# Patient Record
Sex: Female | Born: 1995 | Race: Black or African American | Hispanic: No | Smoking: Never smoker
Health system: Southern US, Community
[De-identification: ages and names within clinical notes are randomized; demographics above are authoritative.]

## PROBLEM LIST (undated history)

## (undated) DIAGNOSIS — Z789 Other specified health status: Secondary | ICD-10-CM

## (undated) HISTORY — PX: WISDOM TOOTH EXTRACTION: SHX21

---

## 2014-09-13 ENCOUNTER — Encounter (HOSPITAL_COMMUNITY): Payer: Self-pay | Admitting: Emergency Medicine

## 2014-09-13 ENCOUNTER — Emergency Department (HOSPITAL_COMMUNITY)
Admission: EM | Admit: 2014-09-13 | Discharge: 2014-09-13 | Disposition: A | Discharge status: Home / Self Care | Payer: No Typology Code available for payment source | Attending: Emergency Medicine | Admitting: Emergency Medicine

## 2014-09-13 DIAGNOSIS — Y9389 Activity, other specified: Secondary | ICD-10-CM | POA: Diagnosis not present

## 2014-09-13 DIAGNOSIS — Y92410 Unspecified street and highway as the place of occurrence of the external cause: Secondary | ICD-10-CM | POA: Diagnosis not present

## 2014-09-13 DIAGNOSIS — Z041 Encounter for examination and observation following transport accident: Secondary | ICD-10-CM | POA: Diagnosis not present

## 2014-09-13 DIAGNOSIS — Y998 Other external cause status: Secondary | ICD-10-CM | POA: Diagnosis not present
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
## 2017-03-16 ENCOUNTER — Ambulatory Visit (HOSPITAL_COMMUNITY)
Admission: EM | Admit: 2017-03-16 | Discharge: 2017-03-16 | Disposition: A | Discharge status: Home / Self Care | Payer: Managed Care, Other (non HMO) | Attending: Internal Medicine | Admitting: Internal Medicine

## 2017-03-16 ENCOUNTER — Encounter (HOSPITAL_COMMUNITY): Payer: Self-pay | Admitting: Emergency Medicine

## 2017-03-16 DIAGNOSIS — R1084 Generalized abdominal pain: Secondary | ICD-10-CM | POA: Diagnosis not present

## 2017-03-16 LAB — POCT URINALYSIS DIP (DEVICE)
BILIRUBIN URINE: NEGATIVE
GLUCOSE, UA: NEGATIVE mg/dL
Hgb urine dipstick: NEGATIVE
KETONES UR: NEGATIVE mg/dL
LEUKOCYTES UA: NEGATIVE
NITRITE: NEGATIVE
PH: 8.5 — AB (ref 5.0–8.0)
Protein, ur: 100 mg/dL — AB
Specific Gravity, Urine: 1.01 (ref 1.005–1.030)
Urobilinogen, UA: 1 mg/dL (ref 0.0–1.0)

## 2017-03-16 LAB — POCT PREGNANCY, URINE: PREG TEST UR: NEGATIVE

## 2017-03-16 MED ORDER — RANITIDINE HCL 150 MG PO TABS: 150.0000 mg | ORAL_TABLET | Freq: Two times a day (BID) | ORAL | 0 refills | Status: AC

## 2017-03-16 MED ORDER — DICYCLOMINE HCL 20 MG PO TABS: 20.0000 mg | ORAL_TABLET | Freq: Three times a day (TID) | ORAL | 0 refills | Status: DC

## 2017-03-16 NOTE — ED Provider Notes (Signed)
[facility]    CSN: [id number] Arrival date & time: 03/16/17  1709     History   Chief Complaint Chief Complaint  Patient presents with  . Abdominal Pain    HPI Megan Reyes is a 22 y.o. female no significant past medical history presenting today with abdominal pain.  States 4 days ago she developed abdominal bloating while she was eating, and felt like her stomach was distended.  The bloating went away, but her pain turned into cramping.  She has had persistent cramping for the past 3 days.  States that it does worsen when she eats, but is still meaning her appetite.  Denies associated nausea or vomiting, and states her bowel movements have been regular.  No diarrhea or constipation.  Last bowel movement was 2 hours ago, normal consistency.  Has daily bowel movements.  Last menstrual cycle was 2/28, patient is not on any form of birth control.  She denies any abnormal vaginal discharge or pelvic pain.  Denies urinary symptoms of dysuria and increased frequency, but does endorse some sensations of incomplete voiding.  She is taking ibuprofen for the pain which helped a little bit, but the pain returned.  Denies any chest pain, chest burning, shortness of breath.  Denies any URI symptoms of cough, congestion, sore throat.  Denies fevers.  HPI  History reviewed. No pertinent past medical history.  There are no active problems to display for this patient.   History reviewed. No pertinent surgical history.  OB History    No data available       Home Medications    Prior to Admission medications   Medication Sig Start Date End Date Taking? Authorizing Provider  dicyclomine (BENTYL) 20 MG tablet Take 1 tablet (20 mg total) by mouth 4 (four) times daily -  before meals and at bedtime for 7 days. 03/16/17 03/23/17  Naylee Frankowski C, PA-C  ranitidine (ZANTAC) 150 MG tablet Take 1 tablet (150 mg total) by mouth 2 (two) times daily for 14 days. 03/16/17 03/30/17  Goble Fudala, Junius CreamerHallie  C, PA-C    Family History No family history on file.  Social History Social History   Tobacco Use  . Smoking status: Never Smoker  Substance Use Topics  . Alcohol use: No  . Drug use: Not on file     Allergies   Patient has no known allergies.   Review of Systems Review of Systems  Constitutional: Negative for fever.  Respiratory: Negative for shortness of breath.   Cardiovascular: Negative for chest pain.  Gastrointestinal: Positive for abdominal pain. Negative for blood in stool, constipation, diarrhea, nausea and vomiting.  Genitourinary: Negative for dysuria, flank pain, genital sores, hematuria, menstrual problem, vaginal bleeding, vaginal discharge and vaginal pain.  Musculoskeletal: Negative for back pain.  Skin: Negative for rash.  Neurological: Negative for dizziness, light-headedness and headaches.     Physical Exam Triage Vital Signs ED Triage Vitals  Enc Vitals Group     BP 03/16/17 1815 115/68     Pulse Rate 03/16/17 1815 90     Resp 03/16/17 1815 16     Temp 03/16/17 1815 98.9 F (37.2 C)     Temp Source 03/16/17 1815 Oral     SpO2 03/16/17 1815 100 %     Weight 03/16/17 1815 140 lb (63.5 kg)     Height 03/16/17 1815 5\' 2"  (1.575 m)     Head Circumference --      Peak Flow --  Pain Score 03/16/17 1814 6     Pain Loc --      Pain Edu? --      Excl. in GC? --    No data found.  Updated Vital Signs BP 115/68   Pulse 90   Temp 98.9 F (37.2 C) (Oral)   Resp 16   Ht 5\' 2"  (1.575 m)   Wt 140 lb (63.5 kg)   LMP 03/02/2017   SpO2 100%   BMI 25.61 kg/m   Visual Acuity Right Eye Distance:   Left Eye Distance:   Bilateral Distance:    Right Eye Near:   Left Eye Near:    Bilateral Near:     Physical Exam  Constitutional: She appears well-developed and well-nourished. No distress.  HENT:  Head: Normocephalic and atraumatic.  Eyes: Conjunctivae are normal.  Neck: Neck supple.  Cardiovascular: Normal rate and regular rhythm.  No  murmur heard. Pulmonary/Chest: Effort normal and breath sounds normal. No respiratory distress.  Abdominal: Soft. There is tenderness.  Abdomen is soft, nondistended, patient does not guard during exam, bowel sounds present throughout all 4 quadrants, mild tenderness diffusely to entire abdomen, negative rebound, negative Murphy's, negative McBurney's, negative Rovsing's  Patient notes that palpation does not worsen pain, just makes her more aware that it is there.  Musculoskeletal: She exhibits no edema.  Neurological: She is alert.  Skin: Skin is warm and dry.  Psychiatric: She has a normal mood and affect.  Nursing note and vitals reviewed.    UC Treatments / Results  Labs (all labs ordered are listed, but only abnormal results are displayed) Labs Reviewed  POCT URINALYSIS DIP (DEVICE) - Abnormal; Notable for the following components:      Result Value   pH 8.5 (*)    Protein, ur 100 (*)    All other components within normal limits  POCT PREGNANCY, URINE    EKG  EKG Interpretation None       Radiology No results found.  Procedures Procedures (including critical care time)  Medications Ordered in UC Medications - No data to display   Initial Impression / Assessment and Plan / UC Course  I have reviewed the triage vital signs and the nursing notes.  Pertinent labs & imaging results that were available during my care of the patient were reviewed by me and considered in my medical decision making (see chart for details).     UA negative, pregnancy test negative, will do trial of Zantac to see if this helps with her pain as well as trial of Bentyl.  Abdomen does not appear emergent at this time or need of further evaluation.  Abdominal exam negative for peritoneal signs.  Minimal associated symptoms.  Discussed strict return precautions. Patient verbalized understanding and is agreeable with plan.   Final Clinical Impressions(s) / UC Diagnoses   Final diagnoses:    Generalized abdominal pain    ED Discharge Orders        Ordered    dicyclomine (BENTYL) 20 MG tablet  3 times daily before meals & bedtime     03/16/17 1905    ranitidine (ZANTAC) 150 MG tablet  2 times daily     03/16/17 1905       Controlled Substance Prescriptions [place] Controlled Substance Registry consulted? Not Applicable   Lew Dawes, [place] 03/16/17 1910

## 2017-03-16 NOTE — Discharge Instructions (Signed)
Please begin Bentyl as prescribed, this is meant to help with spasms of the GI tract.  Please begin Zantac twice daily for the next 2 weeks, this will help with reflux if this is contributing to your pain.  Please return if symptoms worsening or changing, please return if they are not improving in 1-2 weeks.

## 2017-03-16 NOTE — ED Triage Notes (Signed)
PT reports abdominal bloating that started 3 days ago. Bloating resolved, but pain remained.   Denies N/V/D and constipation.
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
## 2018-02-07 ENCOUNTER — Emergency Department (HOSPITAL_COMMUNITY)
Admission: EM | Admit: 2018-02-07 | Discharge: 2018-02-07 | Disposition: A | Discharge status: Home / Self Care | Payer: Managed Care, Other (non HMO) | Attending: Emergency Medicine | Admitting: Emergency Medicine

## 2018-02-07 ENCOUNTER — Emergency Department (HOSPITAL_COMMUNITY): Payer: Managed Care, Other (non HMO)

## 2018-02-07 ENCOUNTER — Encounter (HOSPITAL_COMMUNITY): Payer: Self-pay | Admitting: Emergency Medicine

## 2018-02-07 DIAGNOSIS — Z7982 Long term (current) use of aspirin: Secondary | ICD-10-CM | POA: Insufficient documentation

## 2018-02-07 DIAGNOSIS — Z79899 Other long term (current) drug therapy: Secondary | ICD-10-CM | POA: Insufficient documentation

## 2018-02-07 DIAGNOSIS — Z331 Pregnant state, incidental: Secondary | ICD-10-CM

## 2018-02-07 DIAGNOSIS — R1011 Right upper quadrant pain: Secondary | ICD-10-CM | POA: Diagnosis not present

## 2018-02-07 LAB — CBC WITH DIFFERENTIAL/PLATELET
Abs Immature Granulocytes: 0.08 10*3/uL — ABNORMAL HIGH (ref 0.00–0.07)
BASOS PCT: 1 %
Basophils Absolute: 0.1 10*3/uL (ref 0.0–0.1)
EOS PCT: 1 %
Eosinophils Absolute: 0.1 10*3/uL (ref 0.0–0.5)
HCT: 44.4 % (ref 36.0–46.0)
HEMOGLOBIN: 14.4 g/dL (ref 12.0–15.0)
Immature Granulocytes: 1 %
LYMPHS PCT: 12 %
Lymphs Abs: 1.9 10*3/uL (ref 0.7–4.0)
MCH: 31 pg (ref 26.0–34.0)
MCHC: 32.4 g/dL (ref 30.0–36.0)
MCV: 95.5 fL (ref 80.0–100.0)
Monocytes Absolute: 0.9 10*3/uL (ref 0.1–1.0)
Monocytes Relative: 5 %
NRBC: 0 % (ref 0.0–0.2)
Neutro Abs: 13.4 10*3/uL — ABNORMAL HIGH (ref 1.7–7.7)
Neutrophils Relative %: 80 %
PLATELETS: 389 10*3/uL (ref 150–400)
RBC: 4.65 MIL/uL (ref 3.87–5.11)
RDW: 11.9 % (ref 11.5–15.5)
WBC: 16.4 10*3/uL — AB (ref 4.0–10.5)

## 2018-02-07 LAB — COMPREHENSIVE METABOLIC PANEL
ALK PHOS: 56 U/L (ref 38–126)
ALT: 14 U/L (ref 0–44)
ANION GAP: 15 (ref 5–15)
AST: 20 U/L (ref 15–41)
Albumin: 4.9 g/dL (ref 3.5–5.0)
BUN: 8 mg/dL (ref 6–20)
CHLORIDE: 104 mmol/L (ref 98–111)
CO2: 19 mmol/L — AB (ref 22–32)
Calcium: 9.6 mg/dL (ref 8.9–10.3)
Creatinine, Ser: 0.69 mg/dL (ref 0.44–1.00)
GFR calc non Af Amer: >60 mL/min (ref >60)
GLUCOSE: 97 mg/dL (ref 70–99)
Potassium: 3.3 mmol/L — ABNORMAL LOW (ref 3.5–5.1)
SODIUM: 138 mmol/L (ref 135–145)
Total Bilirubin: 1.4 mg/dL — ABNORMAL HIGH (ref 0.3–1.2)
Total Protein: 8.3 g/dL — ABNORMAL HIGH (ref 6.5–8.1)

## 2018-02-07 LAB — I-STAT BETA HCG BLOOD, ED (MC, WL, AP ONLY): HCG, QUANTITATIVE: 491.4 m[IU]/mL — AB (ref <5)

## 2018-02-07 LAB — HCG, QUANTITATIVE, PREGNANCY: HCG, BETA CHAIN, QUANT, S: 500 m[IU]/mL — AB (ref <5)

## 2018-02-07 LAB — LIPASE, BLOOD: Lipase: 42 U/L (ref 11–51)

## 2018-02-07 MED ORDER — LIDOCAINE VISCOUS HCL 2 % MT SOLN
15.0000 mL | Freq: Once | OROMUCOSAL | Status: AC
Start: 2018-02-07 — End: 2018-02-07
  Administered 2018-02-07: 15 mL via ORAL
  Filled 2018-02-07: qty 15

## 2018-02-07 MED ORDER — ONDANSETRON HCL 4 MG/2ML IJ SOLN
4.0000 mg | Freq: Once | INTRAMUSCULAR | Status: AC
Start: 2018-02-07 — End: 2018-02-07
  Administered 2018-02-07: 4 mg via INTRAVENOUS
  Filled 2018-02-07: qty 2

## 2018-02-07 MED ORDER — MORPHINE SULFATE (PF) 4 MG/ML IV SOLN
4.0000 mg | Freq: Once | INTRAVENOUS | Status: AC
  Administered 2018-02-07: 4 mg via INTRAVENOUS
  Filled 2018-02-07: qty 1

## 2018-02-07 MED ORDER — ALUM & MAG HYDROXIDE-SIMETH 200-200-20 MG/5ML PO SUSP
30.0000 mL | Freq: Once | ORAL | Status: AC
  Administered 2018-02-07: 30 mL via ORAL
  Filled 2018-02-07: qty 30

## 2018-02-07 MED ORDER — HYOSCYAMINE SULFATE 0.125 MG SL SUBL
0.2500 mg | SUBLINGUAL_TABLET | Freq: Once | SUBLINGUAL | Status: AC
  Administered 2018-02-07: 0.25 mg via SUBLINGUAL
  Filled 2018-02-07: qty 2

## 2018-02-07 MED ORDER — SODIUM CHLORIDE 0.9 % IV BOLUS
1000.0000 mL | Freq: Once | INTRAVENOUS | Status: AC
  Administered 2018-02-07: 1000 mL via INTRAVENOUS

## 2018-02-07 NOTE — ED Provider Notes (Signed)
[facility] EMERGENCY DEPARTMENT Provider Note   CSN: [id number] Arrival date & time: 02/07/18  0820     History   Chief Complaint Chief Complaint  Patient presents with  . Abdominal Pain    HPI Nicloe Reyes is a 23 y.o. female   The history is provided by the patient.  Abdominal Pain  Pain location:  Epigastric and RUQ Pain quality: aching, fullness and pressure   Pain radiates to:  Does not radiate Pain severity:  Severe Onset quality:  Sudden Duration:  6 hours Timing:  Constant Progression:  Waxing and waning Chronicity:  Recurrent Context: awakening from sleep and retching   Context: not alcohol use, not diet changes, not eating, not laxative use, not medication withdrawal, not previous surgeries, not recent illness, not recent sexual activity, not recent travel, not sick contacts, not suspicious food intake and not trauma   Relieved by:  Nothing Worsened by:  Palpation Ineffective treatments:  Acetaminophen and antacids Associated symptoms: nausea and vomiting   Associated symptoms: no anorexia, no belching, no chest pain, no chills, no constipation, no cough, no diarrhea, no dysuria, no fatigue, no fever, no flatus, no hematemesis, no hematochezia, no hematuria, no melena, no shortness of breath, no sore throat, no vaginal bleeding and no vaginal discharge   Risk factors: no alcohol abuse, no aspirin use, not elderly, has not had multiple surgeries, no NSAID use, not obese, not pregnant and no recent hospitalization    23 year old female with complaint of epigastric and right upper quadrant abdominal pain.  She has had 3 episodes that are similar lasting between 5 and 7 hours.  She describes the pain as severe, aching, constant, waxing and waning.  The pain is nonradiating.  She has associated nausea and vomiting without diarrhea.  Her pain woke her from sleep this morning.  She has not noticed that it is post prandial.  She denies NSAID or alcohol  abuse.  The patient has no previous surgeries.  She denies diarrhea, fevers chills or shortness of breath History reviewed. No pertinent past medical history.  There are no active problems to display for this patient.   History reviewed. No pertinent surgical history.   OB History   No obstetric history on file.      Home Medications    Prior to Admission medications   Medication Sig Start Date End Date Taking? Authorizing Provider  aspirin-acetaminophen-caffeine (EXCEDRIN MIGRAINE) [phone] MG tablet Take 2 tablets by mouth every 6 (six) hours as needed for headache.   Yes [provider]  ibuprofen (ADVIL,MOTRIN) 200 MG tablet Take 800 mg by mouth every 6 (six) hours as needed for moderate pain.   Yes [provider]  naproxen sodium (ALEVE) 220 MG tablet Take 220 mg by mouth as needed (headache).   Yes [provider]  dicyclomine (BENTYL) 20 MG tablet Take 1 tablet (20 mg total) by mouth 4 (four) times daily -  before meals and at bedtime for 7 days. 03/16/17 03/23/17  Wieters, Hallie C, PA-C  ranitidine (ZANTAC) 150 MG tablet Take 1 tablet (150 mg total) by mouth 2 (two) times daily for 14 days. 03/16/17 03/30/17  Wieters, Junius Creamer, PA-C    Family History No family history on file.  Social History Social History   Tobacco Use  . Smoking status: Never Smoker  Substance Use Topics  . Alcohol use: No  . Drug use: Not on file     Allergies   Patient has no known  allergies.   Review of Systems Review of Systems  Constitutional: Negative for chills, fatigue and fever.  HENT: Negative for sore throat.   Respiratory: Negative for cough and shortness of breath.   Cardiovascular: Negative for chest pain.  Gastrointestinal: Positive for abdominal pain, nausea and vomiting. Negative for abdominal distention, anorexia, constipation, diarrhea, flatus, hematemesis, hematochezia and melena.  Genitourinary: Negative for dysuria, hematuria, vaginal  bleeding and vaginal discharge.  Musculoskeletal: Negative.   Skin: Negative.   Neurological: Negative.   All other systems reviewed and are negative.    Physical Exam Updated Vital Signs BP 115/70   Pulse 95   Temp 97.7 F (36.5 C) (Oral)   Resp 20   SpO2 100%   Physical Exam Vitals signs and nursing note reviewed.  Constitutional:      General: She is not in acute distress.    Appearance: She is well-developed. She is not diaphoretic.  HENT:     Head: Normocephalic and atraumatic.  Eyes:     General: No scleral icterus.    Conjunctiva/sclera: Conjunctivae normal.  Neck:     Musculoskeletal: Normal range of motion.  Cardiovascular:     Rate and Rhythm: Normal rate and regular rhythm.     Heart sounds: Normal heart sounds. No murmur. No friction rub. No gallop.   Pulmonary:     Effort: Pulmonary effort is normal. No respiratory distress.     Breath sounds: Normal breath sounds.  Abdominal:     General: Bowel sounds are normal. There is no distension.     Palpations: Abdomen is soft. There is no mass.     Tenderness: There is abdominal tenderness in the right upper quadrant and epigastric area. There is no right CVA tenderness, left CVA tenderness or guarding.  Skin:    General: Skin is warm and dry.  Neurological:     Mental Status: She is alert and oriented to person, place, and time.  Psychiatric:        Behavior: Behavior normal.      ED Treatments / Results  Labs (all labs ordered are listed, but only abnormal results are displayed) Labs Reviewed  CBC WITH DIFFERENTIAL/PLATELET - Abnormal; Notable for the following components:      Result Value   WBC 16.4 (*)    Neutro Abs 13.4 (*)    Abs Immature Granulocytes 0.08 (*)    All other components within normal limits  COMPREHENSIVE METABOLIC PANEL - Abnormal; Notable for the following components:   Potassium 3.3 (*)    CO2 19 (*)    Total Protein 8.3 (*)    Total Bilirubin 1.4 (*)    All other  components within normal limits  HCG, QUANTITATIVE, PREGNANCY - Abnormal; Notable for the following components:   hCG, Beta Chain, Quant, S 500 (*)    All other components within normal limits  I-STAT BETA HCG BLOOD, ED ([facility], [facility], [facility] ONLY) - Abnormal; Notable for the following components:   I-stat hCG, quantitative 491.4 (*)    All other components within normal limits  LIPASE, BLOOD    EKG None  Radiology [place] Abdomen Limited Ruq  Result Date: 02/07/2018 CLINICAL DATA:  Right upper quadrant pain EXAM: ULTRASOUND ABDOMEN LIMITED RIGHT UPPER QUADRANT COMPARISON:  None. FINDINGS: Gallbladder: No gallstones or wall thickening visualized. There is no pericholecystic fluid. No sonographic Murphy sign noted by sonographer. Common bile duct: Diameter: 3 mm. No intrahepatic or extrahepatic biliary duct dilatation. Liver: No focal lesion identified. Within normal limits  in parenchymal echogenicity. Portal vein is patent on color Doppler imaging with normal direction of blood flow towards the liver. IMPRESSION: Study within normal limits. Electronically Signed   By: Bretta Bang III M.D.   On: 02/07/2018 09:51    Procedures Procedures (including critical care time)  Medications Ordered in ED Medications  ondansetron (ZOFRAN) injection 4 mg (4 mg Intravenous Given 02/07/18 0847)  morphine 4 MG/ML injection 4 mg (4 mg Intravenous Given 02/07/18 0847)  sodium chloride 0.9 % bolus 1,000 mL (0 mLs Intravenous Stopped 02/07/18 1036)  hyoscyamine (LEVSIN SL) SL tablet 0.25 mg (0.25 mg Sublingual Given 02/07/18 1044)  alum & mag hydroxide-simeth (MAALOX/MYLANTA) 200-200-20 MG/5ML suspension 30 mL (30 mLs Oral Given 02/07/18 1044)    And  lidocaine (XYLOCAINE) 2 % viscous mouth solution 15 mL (15 mLs Oral Given 02/07/18 1044)     Initial Impression / Assessment and Plan / ED Course  I have reviewed the triage vital signs and the nursing notes.  Pertinent labs & imaging results that were available during my  care of the patient were reviewed by me and considered in my medical decision making (see chart for details).  Clinical Course as of Feb 08 1511  Wed Feb 07, 2018  [id number] 23 year old female with complaint of epigastric abdominal pain. The emergent DDX for RUQ pain includes but is not limited to Glabladder disease, PUD, Acute Hepatitis, Pancreatitis, pyelonephritis, Pneumonia, Lower lobe PE/Infarct, Kidney stone, GERD, retrocecal appendicitis, Fitz-Hugh-Curtis syndrome, AAA, MI, Zoster. I-STAT hCG positive will verify with quantitative hCG.  Patient has elevated white blood cell count.  I have high suspicion for symptomatic cholelithiasis    [AH]  0920 LMP 01/16/2018   [AH]    Clinical Course User Index [AH] Arthor Captain, PA-C    Patient here with epigastric abdominal pain nausea and vomiting.  Symptoms have resolved.  Her right upper quadrant ultrasound is negative.  Patient's i-STAT hCG and quantitative hCG are both positive.  She is about 2 weeks out from her last menstrual..  I discussed the findings with the patient and that this likely represents very early pregnancy.  Although patient had an elevated white blood Cell count I do believe this is likely acute phase reaction to her symptoms this morning.  Patient advised to start on Gaviscon and prenatal vitamins.  She should confirm pregnancy in a month with repeat pregnancy test.  She appears appropriate for discharge at this time.  I doubt any emergent cause of her symptoms today.  Final Clinical Impressions(s) / ED Diagnoses   Final diagnoses:  RUQ abdominal pain  Incidental pregnancy    ED Discharge Orders    None       Arthor Captain, PA-C 02/07/18 1513    Pricilla Loveless, MD 02/07/18 1521

## 2018-02-07 NOTE — Discharge Instructions (Signed)

## 2018-02-07 NOTE — ED Notes (Signed)
Took patient saline lock out patient is getting dressed °

## 2018-02-07 NOTE — ED Triage Notes (Signed)
Pt here with c/o epigastric pain along with some chest pain , along with n/v , felt better after that now with epigastric pain

## 2018-02-18 ENCOUNTER — Encounter (HOSPITAL_COMMUNITY): Payer: Self-pay | Admitting: Emergency Medicine

## 2018-02-18 ENCOUNTER — Emergency Department (HOSPITAL_COMMUNITY): Payer: Managed Care, Other (non HMO)

## 2018-02-18 ENCOUNTER — Emergency Department (HOSPITAL_COMMUNITY)
Admission: EM | Admit: 2018-02-18 | Discharge: 2018-02-18 | Disposition: A | Discharge status: Home / Self Care | Payer: Managed Care, Other (non HMO) | Attending: Emergency Medicine | Admitting: Emergency Medicine

## 2018-02-18 ENCOUNTER — Other Ambulatory Visit: Payer: Self-pay

## 2018-02-18 DIAGNOSIS — O210 Mild hyperemesis gravidarum: Secondary | ICD-10-CM

## 2018-02-18 DIAGNOSIS — Z3A01 Less than 8 weeks gestation of pregnancy: Secondary | ICD-10-CM | POA: Diagnosis not present

## 2018-02-18 DIAGNOSIS — O9989 Other specified diseases and conditions complicating pregnancy, childbirth and the puerperium: Secondary | ICD-10-CM | POA: Diagnosis present

## 2018-02-18 DIAGNOSIS — O219 Vomiting of pregnancy, unspecified: Secondary | ICD-10-CM | POA: Insufficient documentation

## 2018-02-18 DIAGNOSIS — R1013 Epigastric pain: Secondary | ICD-10-CM | POA: Insufficient documentation

## 2018-02-18 DIAGNOSIS — R110 Nausea: Secondary | ICD-10-CM

## 2018-02-18 LAB — I-STAT BETA HCG BLOOD, ED (MC, WL, AP ONLY): I-stat hCG, quantitative: >2000 m[IU]/mL — ABNORMAL HIGH (ref <5)

## 2018-02-18 LAB — COMPREHENSIVE METABOLIC PANEL
ALT: 13 U/L (ref 0–44)
ANION GAP: 11 (ref 5–15)
AST: 15 U/L (ref 15–41)
Albumin: 4.6 g/dL (ref 3.5–5.0)
Alkaline Phosphatase: 58 U/L (ref 38–126)
BILIRUBIN TOTAL: 1.1 mg/dL (ref 0.3–1.2)
BUN: 10 mg/dL (ref 6–20)
CALCIUM: 9.3 mg/dL (ref 8.9–10.3)
CO2: 21 mmol/L — AB (ref 22–32)
Chloride: 105 mmol/L (ref 98–111)
Creatinine, Ser: 0.52 mg/dL (ref 0.44–1.00)
Glucose, Bld: 86 mg/dL (ref 70–99)
Potassium: 3.6 mmol/L (ref 3.5–5.1)
SODIUM: 137 mmol/L (ref 135–145)
TOTAL PROTEIN: 7.7 g/dL (ref 6.5–8.1)

## 2018-02-18 LAB — LIPASE, BLOOD: LIPASE: 44 U/L (ref 11–51)

## 2018-02-18 LAB — CBC
HCT: 40.1 % (ref 36.0–46.0)
Hemoglobin: 13.1 g/dL (ref 12.0–15.0)
MCH: 31.6 pg (ref 26.0–34.0)
MCHC: 32.7 g/dL (ref 30.0–36.0)
MCV: 96.6 fL (ref 80.0–100.0)
Platelets: 345 10*3/uL (ref 150–400)
RBC: 4.15 MIL/uL (ref 3.87–5.11)
RDW: 12 % (ref 11.5–15.5)
WBC: 13.5 10*3/uL — ABNORMAL HIGH (ref 4.0–10.5)
nRBC: 0 % (ref 0.0–0.2)

## 2018-02-18 MED ORDER — ACETAMINOPHEN 500 MG PO TABS
1000.0000 mg | ORAL_TABLET | Freq: Once | ORAL | Status: AC
  Administered 2018-02-18: 1000 mg via ORAL
  Filled 2018-02-18: qty 2

## 2018-02-18 MED ORDER — ALUM & MAG HYDROXIDE-SIMETH 200-200-20 MG/5ML PO SUSP
30.0000 mL | Freq: Once | ORAL | Status: AC
  Administered 2018-02-18: 30 mL via ORAL
  Filled 2018-02-18: qty 30

## 2018-02-18 MED ORDER — SUCRALFATE 1 G PO TABS: 1.0000 g | ORAL_TABLET | Freq: Four times a day (QID) | ORAL | 0 refills | Status: AC | PRN

## 2018-02-18 MED ORDER — SUCRALFATE 1 G PO TABS
1.0000 g | ORAL_TABLET | Freq: Once | ORAL | Status: AC
  Administered 2018-02-18: 1 g via ORAL
  Filled 2018-02-18: qty 1

## 2018-02-18 MED ORDER — DOXYLAMINE-PYRIDOXINE 10-10 MG PO TBEC: 1.0000 | DELAYED_RELEASE_TABLET | Freq: Two times a day (BID) | ORAL | 1 refills | Status: DC | PRN

## 2018-02-18 MED ORDER — ONDANSETRON 4 MG PO TBDP
4.0000 mg | ORAL_TABLET | Freq: Once | ORAL | Status: AC
  Administered 2018-02-18: 4 mg via ORAL
  Filled 2018-02-18: qty 1

## 2018-02-18 NOTE — ED Notes (Signed)
ED Provider at bedside. BELO 

## 2018-02-18 NOTE — Discharge Instructions (Addendum)
You were evaluated in the Emergency Department and after careful evaluation, we did not find any emergent condition requiring admission or further testing in the hospital.  Your symptoms today seem to be due to pain related to acid reflux as well as morning sickness.  Use the medications provided as needed for discomfort.  Your OB doctor may also have other ideas to help control your symptoms.  Please return to the Emergency Department if you experience any worsening of your condition.  We encourage you to follow up with a primary care provider.  Thank you for allowing [place] to be a part of your care.

## 2018-02-18 NOTE — ED Notes (Signed)
PT AND FAMILY UNDERSTAND FAMILY IS ENCOURAGED TO REMAIN IN THE CHAIR FOR BETTER CARE TO THE PATIENT.

## 2018-02-18 NOTE — ED Triage Notes (Signed)
Pt c/o epigastric pain with nausea. Pt states she has had ongoing epigastric issues and is taking OTC meds. Pt is approximately [redacted] weeks pregnant.

## 2018-02-18 NOTE — ED Provider Notes (Signed)
[facility] Emergency Department Provider Note MRN:  [id number]  Arrival date & time: 02/18/18     Chief Complaint   Abdominal Pain   History of Present Illness   Megan Reyes is a 23 y.o. year-old female with no pertinent past medical history presenting to the ED with chief complaint of abdominal pain.  Pain began 3 weeks ago, located in the upper quadrants, sudden onset.  Was evaluated here in the emergency department and discharge.  Pain is intermittent, seems to be worse after sleeping through the night while laying flat.  Not sure if worse with meals.  Denies fever, endorsing occasional diarrhea, some nausea, no vomiting.  Pain is moderate, no other exacerbating relieving factors.  Patient explains that she is [redacted] weeks pregnant.  Review of Systems  A complete 10 system review of systems was obtained and all systems are negative except as noted in the HPI and PMH.   Patient's Health History   History reviewed. No pertinent past medical history.  History reviewed. No pertinent surgical history.  No family history on file.  Social History   Socioeconomic History  . Marital status: Single    Spouse name: Not on file  . Number of children: Not on file  . Years of education: Not on file  . Highest education level: Not on file  Occupational History  . Not on file  Social Needs  . Financial resource strain: Not on file  . Food insecurity:    Worry: Not on file    Inability: Not on file  . Transportation needs:    Medical: Not on file    Non-medical: Not on file  Tobacco Use  . Smoking status: Never Smoker  Substance and Sexual Activity  . Alcohol use: No  . Drug use: Not on file  . Sexual activity: Not on file  Lifestyle  . Physical activity:    Days per week: Not on file    Minutes per session: Not on file  . Stress: Not on file  Relationships  . Social connections:    Talks on phone: Not on file    Gets together: Not on file    Attends  religious service: Not on file    Active member of club or organization: Not on file    Attends meetings of clubs or organizations: Not on file    Relationship status: Not on file  . Intimate partner violence:    Fear of current or ex partner: Not on file    Emotionally abused: Not on file    Physically abused: Not on file    Forced sexual activity: Not on file  Other Topics Concern  . Not on file  Social History Narrative  . Not on file     Physical Exam  Vital Signs and Nursing Notes reviewed Vitals:   02/18/18 0806 02/18/18 1028  BP: 140/69 118/69  Pulse: (!) 104 82  Resp: 16 18  Temp: 98.3 F (36.8 C)   SpO2: 100% 100%    CONSTITUTIONAL: Well-appearing, NAD NEURO:  Alert and oriented x 3, no focal deficits EYES:  eyes equal and reactive ENT/NECK:  no LAD, no JVD CARDIO: Regular rate, well-perfused, normal S1 and S2 PULM:  CTAB no wheezing or rhonchi GI/GU:  normal bowel sounds, non-distended, non-tender MSK/SPINE:  No gross deformities, no edema SKIN:  no rash, atraumatic PSYCH:  Appropriate speech and behavior  Diagnostic and Interventional Summary    Labs Reviewed  CBC - Abnormal;  Notable for the following components:      Result Value   WBC 13.5 (*)    All other components within normal limits  COMPREHENSIVE METABOLIC PANEL - Abnormal; Notable for the following components:   CO2 21 (*)    All other components within normal limits  I-STAT BETA HCG BLOOD, ED ([facility], [facility], [facility] ONLY) - Abnormal; Notable for the following components:   I-stat hCG, quantitative >2,000.0 (*)    All other components within normal limits  LIPASE, BLOOD    US Abdomen Limited RUQ  Final Result      Medications  acetaminophen (TYLENOL) tablet 1,000 mg (1,000 mg Oral Given 02/18/18 0939)  sucralfate (CARAFATE) tablet 1 g (1 g Oral Given 02/18/18 0939)  alum & mag hydroxide-simeth (MAALOX/MYLANTA) 200-200-20 MG/5ML suspension 30 mL (30 mLs Oral Given 02/18/18 1137)  ondansetron  (ZOFRAN-ODT) disintegrating tablet 4 mg (4 mg Oral Given 02/18/18 1137)     Procedures Critical Care  ED Course and Medical Decision Making  I have reviewed the triage vital signs and the nursing notes.  Pertinent labs & imaging results that were available during my care of the patient were reviewed by me and considered in my medical decision making (see below for details).  Reassuring abdominal exam, very soft, nontender in this 23 year old female with early pregnancy, otherwise healthy.  Worse with lying flat, favoring GERD.  Had a work-up 3 weeks ago with normal right upper quadrant ultrasound.  Patient explains its worse this time, more constant, will repeat ultrasound and labs.  Work-up again unrevealing, patient feeling some improvement after Maalox and Zofran.  Feeling well enough to be discharged, will prescribe Diclegis, Carafate, educated on dietary/lifestyle changes with regard to GERD.  Advised OB follow-up for further ideas on symptom control.  Elmer Sow. Pilar Plate, MD [facility] Emergency Medicine [facility] [email or web address]  Final Clinical Impressions(s) / ED Diagnoses     ICD-10-CM   1. Nausea R11.0   2. Epigastric pain R10.13 US Abdomen Limited RUQ    US Abdomen Limited RUQ  3. Morning sickness O21.0     ED Discharge Orders         Ordered    Doxylamine-Pyridoxine 10-10 MG TBEC  2 times daily PRN     02/18/18 1215    sucralfate (CARAFATE) 1 g tablet  4 times daily PRN     02/18/18 1215             Sabas Sous, MD 02/18/18 1217

## 2018-02-18 NOTE — ED Notes (Signed)
Family at bedside. 

## 2018-07-21 ENCOUNTER — Encounter (HOSPITAL_COMMUNITY): Payer: Self-pay

## 2018-07-21 ENCOUNTER — Inpatient Hospital Stay (HOSPITAL_COMMUNITY)
Admission: EM | Admit: 2018-07-21 | Discharge: 2018-07-21 | Disposition: A | Discharge status: Home / Self Care | Payer: Managed Care, Other (non HMO) | Attending: Obstetrics and Gynecology | Admitting: Obstetrics and Gynecology

## 2018-07-21 ENCOUNTER — Other Ambulatory Visit: Payer: Self-pay

## 2018-07-21 DIAGNOSIS — B9689 Other specified bacterial agents as the cause of diseases classified elsewhere: Secondary | ICD-10-CM | POA: Insufficient documentation

## 2018-07-21 DIAGNOSIS — N760 Acute vaginitis: Secondary | ICD-10-CM | POA: Diagnosis not present

## 2018-07-21 DIAGNOSIS — Z79899 Other long term (current) drug therapy: Secondary | ICD-10-CM | POA: Insufficient documentation

## 2018-07-21 DIAGNOSIS — O26892 Other specified pregnancy related conditions, second trimester: Secondary | ICD-10-CM | POA: Insufficient documentation

## 2018-07-21 DIAGNOSIS — Z3A26 26 weeks gestation of pregnancy: Secondary | ICD-10-CM | POA: Insufficient documentation

## 2018-07-21 DIAGNOSIS — O23592 Infection of other part of genital tract in pregnancy, second trimester: Secondary | ICD-10-CM | POA: Insufficient documentation

## 2018-07-21 DIAGNOSIS — O98812 Other maternal infectious and parasitic diseases complicating pregnancy, second trimester: Secondary | ICD-10-CM | POA: Diagnosis not present

## 2018-07-21 LAB — URINALYSIS, ROUTINE W REFLEX MICROSCOPIC
Bilirubin Urine: NEGATIVE
Glucose, UA: NEGATIVE mg/dL
Hgb urine dipstick: NEGATIVE
Ketones, ur: NEGATIVE mg/dL
Leukocytes,Ua: NEGATIVE
Nitrite: NEGATIVE
Protein, ur: NEGATIVE mg/dL
Specific Gravity, Urine: 1.024 (ref 1.005–1.030)
pH: 6 (ref 5.0–8.0)

## 2018-07-21 LAB — WET PREP, GENITAL
Sperm: NONE SEEN
Trich, Wet Prep: NONE SEEN
WBC, Wet Prep HPF POC: NONE SEEN
Yeast Wet Prep HPF POC: NONE SEEN

## 2018-07-21 LAB — POCT FERN TEST: POCT Fern Test: NEGATIVE

## 2018-07-21 LAB — AMNISURE RUPTURE OF MEMBRANE (ROM) NOT AT ARMC: Amnisure ROM: NEGATIVE

## 2018-07-21 MED ORDER — METRONIDAZOLE 500 MG PO TABS: 500.0000 mg | ORAL_TABLET | Freq: Two times a day (BID) | ORAL | 0 refills | Status: AC

## 2018-07-21 NOTE — MAU Note (Signed)
Megan Reyes is a 23 y.o. at [redacted]w[redacted]d here in MAU reporting:  +LOF, clear, has continued to trickle  Onset of complaint: 506pm  Called her on call dr and was told to come in Pain score: denies Vaginal bleeding: denies Vitals:   07/21/18 1819  BP: 129/72  Pulse: (!) 107  Resp: 19  Temp: 97.7 F (36.5 C)  SpO2: 99%     +FM Lab orders placed from triage: ua

## 2018-07-21 NOTE — Discharge Instructions (Signed)

## 2018-07-21 NOTE — MAU Provider Note (Addendum)
Patient Megan Reyes is a 23 y.o. G1P0 At 6976w4d here with complaints of LOF at 5 pm. She denies vaginal bleeding, decreased fetal movements, dysuria, low back pain, fever, SOB, chills, constipation or diarrhea. She denies contractions. She has had an uncomplicated pregnancy thus far. [facility]   History     CSN: [id number]  Arrival date and time: 07/21/18 1748   None     Chief Complaint  Patient presents with  . Rupture of Membranes   Vaginal Discharge The patient's primary symptoms include vaginal discharge. The current episode started today. The problem occurs rarely. The problem has been resolved. The patient is experiencing no pain. She is pregnant. Pertinent negatives include no abdominal pain, chills, constipation, diarrhea, discolored urine, dysuria, sore throat or urgency.   Patient says that she felt a gush of fluid when she got out of the shower at 1700; she laid down and felt more water coming out. She felt like the tops of her legs were wet as well. The fluid was clear, no odor. She denies contractions.   OB History    Gravida  1   Para      Term      Preterm      AB      Living        SAB      TAB      Ectopic      Multiple      Live Births              History reviewed. No pertinent past medical history.  Past Surgical History:  Procedure Laterality Date  . WISDOM TOOTH EXTRACTION      Family History  Problem Relation Age of Onset  . Cancer Maternal Grandfather        prostate    Social History   Tobacco Use  . Smoking status: Never Smoker  . Smokeless tobacco: Never Used  Substance Use Topics  . Alcohol use: No  . Drug use: Never    Allergies: No Known Allergies  Medications Prior to Admission  Medication Sig Dispense Refill Last Dose  . aluminum hydroxide-magnesium carbonate (GAVISCON) 95-358 MG/15ML SUSP Take 5 mLs by mouth daily as needed for indigestion or heartburn.     . dicyclomine (BENTYL) 20 MG  tablet Take 1 tablet (20 mg total) by mouth 4 (four) times daily -  before meals and at bedtime for 7 days. (Patient not taking: Reported on 02/18/2018) 28 tablet 0   . Doxylamine-Pyridoxine 10-10 MG TBEC Take 1 tablet by mouth 2 (two) times daily as needed (nausea in early pregnancy). 60 tablet 1   . ibuprofen (ADVIL,MOTRIN) 200 MG tablet Take 800 mg by mouth every 6 (six) hours as needed for moderate pain.     . Prenatal MV-Min-Fe Fum-FA-DHA (PRENATAL 1) 30-0.975-200 MG CAPS Take 2 capsules by mouth daily.     . ranitidine (ZANTAC) 150 MG tablet Take 1 tablet (150 mg total) by mouth 2 (two) times daily for 14 days. (Patient not taking: Reported on 02/18/2018) 28 tablet 0   . sucralfate (CARAFATE) 1 g tablet Take 1 tablet (1 g total) by mouth 4 (four) times daily as needed. 30 tablet 0     Review of Systems  Constitutional: Negative for chills.  HENT: Negative for sore throat.   Gastrointestinal: Negative for abdominal pain, constipation and diarrhea.  Genitourinary: Positive for vaginal discharge. Negative for dysuria and urgency.   Physical Exam  Blood pressure 129/72, pulse (!) 107, temperature 97.7 F (36.5 C), temperature source Oral, resp. rate 19, weight 76.5 kg, last menstrual period 01/16/2018, SpO2 99 %.  Physical Exam  Constitutional: She is oriented to person, place, and time. She appears well-developed.  HENT:  Head: Normocephalic.  Neck: Normal range of motion.  Cardiovascular: Normal rate.  Respiratory: Effort normal.  GI: Soft.  Genitourinary:    Vagina normal.     Genitourinary Comments: NEFG; no pooling in the vagina, white mucousy-discharge in the vagina, no blood. No CMT, no suprapubic or adnexal tenderness. Cervix is closed, long and thick.    Musculoskeletal: Normal range of motion.  Neurological: She is alert and oriented to person, place, and time.  Skin: Skin is warm and dry.    MAU Course  Procedures  MDM -NST: 145 bpm, mod var, present acel, neg  decels, no contractions.  -wet prep  Positive for clue cells, will treat for BV. -Amnisure is negative  Assessment and Plan   1. Bacterial vaginosis    -Patient safe for discharge with RX for Flagyl for BV. Explained how to take medication.  -Reviewed warning signs, signs of ROM and when to return to MAU.  -Keep follow up appt with [facility] as scheduled.  -All questions answered.   Mervyn Skeeters Aulton Routt 07/21/2018, 7:22 PM

## 2018-07-25 LAB — GC/CHLAMYDIA PROBE AMP (~~LOC~~) NOT AT ARMC
Chlamydia: NEGATIVE
Neisseria Gonorrhea: NEGATIVE

## 2018-09-20 LAB — OB RESULTS CONSOLE GBS: GBS: NEGATIVE

## 2018-10-10 ENCOUNTER — Inpatient Hospital Stay (HOSPITAL_COMMUNITY)
Admission: AD | Admit: 2018-10-10 | Discharge: 2018-10-12 | DRG: 807 | Disposition: A | Discharge status: Home / Self Care | Payer: Managed Care, Other (non HMO) | Attending: Obstetrics & Gynecology | Admitting: Obstetrics & Gynecology

## 2018-10-10 ENCOUNTER — Other Ambulatory Visit: Payer: Self-pay

## 2018-10-10 ENCOUNTER — Encounter (HOSPITAL_COMMUNITY): Payer: Self-pay

## 2018-10-10 DIAGNOSIS — Z3A39 39 weeks gestation of pregnancy: Secondary | ICD-10-CM

## 2018-10-10 DIAGNOSIS — Z20828 Contact with and (suspected) exposure to other viral communicable diseases: Secondary | ICD-10-CM | POA: Diagnosis present

## 2018-10-10 DIAGNOSIS — O26893 Other specified pregnancy related conditions, third trimester: Secondary | ICD-10-CM | POA: Diagnosis present

## 2018-10-10 HISTORY — DX: Other specified health status: Z78.9

## 2018-10-10 LAB — CBC
HCT: 41.2 % (ref 36.0–46.0)
Hemoglobin: 13.9 g/dL (ref 12.0–15.0)
MCH: 32.6 pg (ref 26.0–34.0)
MCHC: 33.7 g/dL (ref 30.0–36.0)
MCV: 96.5 fL (ref 80.0–100.0)
Platelets: 221 10*3/uL (ref 150–400)
RBC: 4.27 MIL/uL (ref 3.87–5.11)
RDW: 12.9 % (ref 11.5–15.5)
WBC: 11.6 10*3/uL — ABNORMAL HIGH (ref 4.0–10.5)
nRBC: 0 % (ref 0.0–0.2)

## 2018-10-10 LAB — ABO/RH: ABO/RH(D): B POS

## 2018-10-10 LAB — TYPE AND SCREEN
ABO/RH(D): B POS
Antibody Screen: NEGATIVE

## 2018-10-10 LAB — SARS CORONAVIRUS 2 BY RT PCR (HOSPITAL ORDER, PERFORMED IN ~~LOC~~ HOSPITAL LAB): SARS Coronavirus 2: NEGATIVE

## 2018-10-10 LAB — POCT FERN TEST: POCT Fern Test: POSITIVE

## 2018-10-10 MED ORDER — EPHEDRINE 5 MG/ML INJ: 10.0000 mg | INTRAVENOUS | Status: DC | PRN

## 2018-10-10 MED ORDER — OXYCODONE-ACETAMINOPHEN 5-325 MG PO TABS: 1.0000 | ORAL_TABLET | ORAL | Status: DC | PRN

## 2018-10-10 MED ORDER — FENTANYL CITRATE (PF) 100 MCG/2ML IJ SOLN
50.0000 ug | Freq: Once | INTRAMUSCULAR | Status: AC
  Administered 2018-10-10: 50 ug via INTRAVENOUS
  Filled 2018-10-10: qty 2

## 2018-10-10 MED ORDER — FENTANYL CITRATE (PF) 100 MCG/2ML IJ SOLN: 100.0000 ug | Freq: Once | INTRAMUSCULAR | Status: DC

## 2018-10-10 MED ORDER — ONDANSETRON HCL 4 MG/2ML IJ SOLN: 4.0000 mg | Freq: Four times a day (QID) | INTRAMUSCULAR | Status: DC | PRN

## 2018-10-10 MED ORDER — OXYCODONE-ACETAMINOPHEN 5-325 MG PO TABS: 2.0000 | ORAL_TABLET | ORAL | Status: DC | PRN

## 2018-10-10 MED ORDER — LIDOCAINE HCL (PF) 1 % IJ SOLN
30.0000 mL | INTRAMUSCULAR | Status: AC | PRN
  Administered 2018-10-10: 30 mL via SUBCUTANEOUS
  Filled 2018-10-10: qty 30

## 2018-10-10 MED ORDER — FENTANYL CITRATE (PF) 100 MCG/2ML IJ SOLN
50.0000 ug | INTRAMUSCULAR | Status: DC | PRN
  Administered 2018-10-10: 100 ug via INTRAVENOUS
  Filled 2018-10-10: qty 2

## 2018-10-10 MED ORDER — LACTATED RINGERS IV SOLN
INTRAVENOUS | Status: DC
  Administered 2018-10-10: 18:00:00 via INTRAVENOUS

## 2018-10-10 MED ORDER — LACTATED RINGERS IV SOLN
500.0000 mL | INTRAVENOUS | Status: DC | PRN
  Administered 2018-10-10: 500 mL via INTRAVENOUS

## 2018-10-10 MED ORDER — SOD CITRATE-CITRIC ACID 500-334 MG/5ML PO SOLN: 30.0000 mL | ORAL | Status: DC | PRN

## 2018-10-10 MED ORDER — PHENYLEPHRINE 40 MCG/ML (10ML) SYRINGE FOR IV PUSH (FOR BLOOD PRESSURE SUPPORT): 80.0000 ug | PREFILLED_SYRINGE | INTRAVENOUS | Status: DC | PRN

## 2018-10-10 MED ORDER — LACTATED RINGERS IV SOLN: 500.0000 mL | Freq: Once | INTRAVENOUS | Status: DC

## 2018-10-10 MED ORDER — FENTANYL-BUPIVACAINE-NACL 0.5-0.125-0.9 MG/250ML-% EP SOLN: 12.0000 mL/h | EPIDURAL | Status: DC | PRN

## 2018-10-10 MED ORDER — ACETAMINOPHEN 325 MG PO TABS: 650.0000 mg | ORAL_TABLET | ORAL | Status: DC | PRN

## 2018-10-10 MED ORDER — DIPHENHYDRAMINE HCL 50 MG/ML IJ SOLN: 12.5000 mg | INTRAMUSCULAR | Status: DC | PRN

## 2018-10-10 MED ORDER — OXYTOCIN BOLUS FROM INFUSION
500.0000 mL | Freq: Once | INTRAVENOUS | Status: AC
  Administered 2018-10-10: 500 mL via INTRAVENOUS

## 2018-10-10 MED ORDER — OXYTOCIN 40 UNITS IN NORMAL SALINE INFUSION - SIMPLE MED
2.5000 [IU]/h | INTRAVENOUS | Status: DC
  Filled 2018-10-10: qty 1000

## 2018-10-10 MED ORDER — FLEET ENEMA 7-19 GM/118ML RE ENEM: 1.0000 | ENEMA | RECTAL | Status: DC | PRN

## 2018-10-10 NOTE — MAU Note (Signed)
.  Megan Reyes is a 23 y.o. at [redacted]w[redacted]d here in MAU reporting: ctx and rupture of membranes  Onset of complaint: SROM at 1600 with ctx  Pain score: 8 There were no vitals filed for this visit.   FHT:130

## 2018-10-10 NOTE — Progress Notes (Signed)
Megan Reyes is a 23 y.o. G1P0000 at [redacted]w[redacted]d  Subjective: Coping well w/ labor, breathing and changing position for comfort.   Objective: BP 128/67   Pulse 79   Temp 98.3 F (36.8 C) (Oral)   Resp 18   Ht 5\' 2"  (1.575 m)   LMP 01/16/2018 (Exact Date)   SpO2 99%   BMI 30.86 kg/m  No intake/output data recorded. No intake/output data recorded.  FHT:  FHR: 115 bpm, variability: moderate,  accelerations:  Present,  decelerations:  Present early, variable FSE applied  UC:   regular, every 3-4 minutes SVE:   Dilation: Lip/rim Effacement (%): 80 Station: -1 Exam by:: Sheryle Hail, RN  Labs: Lab Results  Component Value Date   WBC 11.6 (H) 10/10/2018   HGB 13.9 10/10/2018   HCT 41.2 10/10/2018   MCV 96.5 10/10/2018   PLT 221 10/10/2018    Assessment / Plan: Spontaneous labor, progressing normally  Labor: Progressing normally Fetal Wellbeing:  Category I      -FSE applied for low baseline Pain Control:  IV pain meds I/D:  GBS negative Anticipated MOD:  NSVD   Dr. Alesia Richards updated.   Arrie Eastern MSN, CNM 10/10/2018, 8:49 PM

## 2018-10-10 NOTE — H&P (Addendum)
Megan Reyes is a 23 y.o. female G1P0 at [redacted] weeks EGA presenting for SROM at 1600 and contractions every 2 minutes.  LMP 01/16/2018 EDC 10/17/2018 by first trimester [place].  OB History    Gravida  1   Para  0   Term  0   Preterm  0   AB  0   Living  0     SAB  0   TAB  0   Ectopic  0   Multiple  0   Live Births  0          Past Medical History:  Diagnosis Date  . Medical history non-contributory   Chronic headache disorder.  Past Surgical History:  Procedure Laterality Date  . WISDOM TOOTH EXTRACTION     Family History: family history includes Asthma in her brother; Cancer in her maternal grandfather. Social History:  reports that she has never smoked. She has never used smokeless tobacco. She reports that she does not drink alcohol or use drugs.     Maternal Diabetes: No Genetic Screening: Normal Maternal Ultrasounds/Referrals: Normal Fetal Ultrasounds or other Referrals:  None Maternal Substance Abuse:  No Significant Maternal Medications:  None Significant Maternal Lab Results:  Group B Strep negative Other Comments:  71 lb weight gain in pregnancy.  Growth [place] 4 weeks ago 5lbs 5 oz (AGA)  ROS  Constitutional: Denies fevers/chills Cardiovascular: Denies chest pain or palpitations Pulmonary: Denies coughing or wheezing Gastrointestinal: Denies nausea, vomiting or diarrhea Genitourinary: Denies pelvic pain, unusual vaginal bleeding, unusual vaginal discharge, dysuria, urgency or frequency. With contractions.  Musculoskeletal: Denies muscle or joint aches and pain.  Neurology: Denies abnormal sensations such as tingling or numbness.   History Dilation: 2.5 Effacement (%): 80 Station: -3 Exam by:: n druebbisch rn Blood pressure 136/77, pulse 95, temperature 98.2 F (36.8 C), temperature source Oral, resp. rate 18, height 5\' 2"  (1.575 m), last menstrual period 01/16/2018, SpO2 99 %.  Vitals:   10/10/18 1651 10/10/18 1659 10/10/18 1706  BP: 140/78   136/77  Pulse: (!) 104  95  Resp: 18    Temp: 98.2 F (36.8 C)    TempSrc: Oral    SpO2:   99%  Height:  5\' 2"  (1.575 m)    EFM: 120 Baseline, moderate variability, reactive. TOCO: Contractions every 2 to 3 minutes.  Exam Physical Exam  Constitutional: She is oriented to person, place, and time. She appears well-developed and well-nourished.  HENT:  Head: Normocephalic and atraumatic.  Neck: Normal range of motion.  Cardiovascular: Normal rate, regular rhythm and normal heart sounds.   Respiratory: Effort normal and breath sounds normal.  GI: Soft. Bowel sounds are normal.  Neurological: She is alert and oriented to person, place, and time.  Skin: Skin is warm and dry.  Psychiatric: She has a normal mood and affect. Her behavior is normal.  Gravid uterus Prenatal labs: ABO, Rh: --/--/PENDING (10/07 1805) B positive Antibody: PENDING (10/07 1805)  Negative Rubella:  Immune RPR:   NR HBsAg:  Neg  HIV:  Neg  GBS:   Neg  Assessment/Plan:  23 y/o G1P0 at [redacted] weeks EGA with SROM and with contractions, Admit to labor and delivery Fentanyl for pain medication prn, may get epidural prn. I discussed with the patient risks, benefits and alternatives of different pain medications used in pregnancy.   Plan to reassess cervix 4 hrs from last exam, about 8 pm.  Alinda Dooms, MD,  10/10/2018, 6:51 PM

## 2018-10-11 LAB — CBC
HCT: 36.8 % (ref 36.0–46.0)
Hemoglobin: 12.2 g/dL (ref 12.0–15.0)
MCH: 31.9 pg (ref 26.0–34.0)
MCHC: 33.2 g/dL (ref 30.0–36.0)
MCV: 96.1 fL (ref 80.0–100.0)
Platelets: 210 10*3/uL (ref 150–400)
RBC: 3.83 MIL/uL — ABNORMAL LOW (ref 3.87–5.11)
RDW: 12.9 % (ref 11.5–15.5)
WBC: 17.7 10*3/uL — ABNORMAL HIGH (ref 4.0–10.5)
nRBC: 0 % (ref 0.0–0.2)

## 2018-10-11 LAB — RPR: RPR Ser Ql: NONREACTIVE

## 2018-10-11 MED ORDER — SENNOSIDES-DOCUSATE SODIUM 8.6-50 MG PO TABS
2.0000 | ORAL_TABLET | ORAL | Status: DC
  Administered 2018-10-12: 2 via ORAL
  Filled 2018-10-11 (×2): qty 2

## 2018-10-11 MED ORDER — ONDANSETRON HCL 4 MG PO TABS: 4.0000 mg | ORAL_TABLET | ORAL | Status: DC | PRN

## 2018-10-11 MED ORDER — DIBUCAINE (PERIANAL) 1 % EX OINT: 1.0000 "application " | TOPICAL_OINTMENT | CUTANEOUS | Status: DC | PRN

## 2018-10-11 MED ORDER — COCONUT OIL OIL
1.0000 "application " | TOPICAL_OIL | Status: DC | PRN
  Administered 2018-10-11: 1 via TOPICAL

## 2018-10-11 MED ORDER — ONDANSETRON HCL 4 MG/2ML IJ SOLN: 4.0000 mg | INTRAMUSCULAR | Status: DC | PRN

## 2018-10-11 MED ORDER — BENZOCAINE-MENTHOL 20-0.5 % EX AERO
1.0000 "application " | INHALATION_SPRAY | CUTANEOUS | Status: DC | PRN
  Administered 2018-10-11: 1 via TOPICAL
  Filled 2018-10-11: qty 56

## 2018-10-11 MED ORDER — DIPHENHYDRAMINE HCL 25 MG PO CAPS: 25.0000 mg | ORAL_CAPSULE | Freq: Four times a day (QID) | ORAL | Status: DC | PRN

## 2018-10-11 MED ORDER — TETANUS-DIPHTH-ACELL PERTUSSIS 5-2.5-18.5 LF-MCG/0.5 IM SUSP: 0.5000 mL | Freq: Once | INTRAMUSCULAR | Status: DC

## 2018-10-11 MED ORDER — AMMONIA AROMATIC IN INHA
RESPIRATORY_TRACT | Status: AC
  Filled 2018-10-11: qty 10

## 2018-10-11 MED ORDER — ACETAMINOPHEN 325 MG PO TABS
650.0000 mg | ORAL_TABLET | ORAL | Status: DC | PRN
  Administered 2018-10-12: 650 mg via ORAL
  Filled 2018-10-11: qty 2

## 2018-10-11 MED ORDER — PRENATAL MULTIVITAMIN CH
1.0000 | ORAL_TABLET | Freq: Every day | ORAL | Status: DC
  Administered 2018-10-11 – 2018-10-12 (×2): 1 via ORAL
  Filled 2018-10-11 (×2): qty 1

## 2018-10-11 MED ORDER — SIMETHICONE 80 MG PO CHEW: 80.0000 mg | CHEWABLE_TABLET | ORAL | Status: DC | PRN

## 2018-10-11 MED ORDER — WITCH HAZEL-GLYCERIN EX PADS: 1.0000 "application " | MEDICATED_PAD | CUTANEOUS | Status: DC | PRN

## 2018-10-11 MED ORDER — IBUPROFEN 600 MG PO TABS
600.0000 mg | ORAL_TABLET | Freq: Four times a day (QID) | ORAL | Status: DC
  Administered 2018-10-11 – 2018-10-12 (×5): 600 mg via ORAL
  Filled 2018-10-11 (×6): qty 1

## 2018-10-11 NOTE — Lactation Note (Signed)
This note was copied from a baby's chart. Lactation Consultation Note  Patient Name: Megan Reyes Today's Date: 10/11/2018    Mom called for assist. Mom had recently fed from the L breast. With Mom's permission, I placed her in a slightly laid-back position. Infant needed some mild assistance with latching to the R breast. Mom did have some initial tenderness, but it subsided with the latch.   Infant's cheeks were noted to be dimpling, but dimpling corresponded with swallows (verified by cervical auscultation & with a suck:swallow ratio of 1:1).   Mom's questions were answered. Mom was pleased that there is evidence of transferring milk.    Matthias Hughs [facility] 10/11/2018, 6:43 PM

## 2018-10-11 NOTE — Lactation Note (Signed)
This note was copied from a baby's chart. Lactation Consultation Note  Patient Name: Megan Reyes Today's Date: 10/11/2018   Initial visit at 15 hours of life. Mom is a P1 who reports + breast changes w/pregnancy. Mom said her biggest concern was that infant wasn't getting enough. Hand expression was then taught to Mom & her colostrum flowed very easily.   Mom noted to have a symmetrical area between and underneath a portion of her breasts that is hyperpigmented with some skin dryness. Mom says it has been there since about 6.5 months pregnant. It does not cause pain or itching and her provider is aware.   Mom is going to call me to return to observe a latch. Matthias Hughs [facility] 10/11/2018, 12:25 PM

## 2018-10-11 NOTE — Progress Notes (Signed)
PPD# 1 SVD w/  Information for the patient's newborn:  Phala, Schraeder Girl Vona [[id number]]  female    Baby Name Webb Silversmith Circumcision not applicable   S:   Reports feeling very good Tolerating PO fluid and solids No nausea or vomiting Bleeding is light, no clots Pain controlled with acetaminophen Up ad lib / ambulatory / voiding w/o difficulty Breast    O:   VS: BP 112/78 (BP Location: Right Arm)   Pulse 82   Temp 98.5 F (36.9 C) (Oral)   Resp 12   Ht 5\' 2"  (1.575 m)   Wt 76.5 kg   LMP 01/16/2018 (Exact Date)   SpO2 97%   Breastfeeding Unknown   BMI 30.86 kg/m   LABS:  Recent Labs    10/10/18 1719 10/11/18 0554  WBC 11.6* 17.7*  HGB 13.9 12.2  PLT 221 210   Blood type: --/--/B POS, B POS Performed at [facility], [address] [place],  [address]  [phone]/07 1805) Rubella:                        I&O: Intake/Output      10/07 0701 - 10/08 0700 10/08 0701 - 10/09 0700   I.V. (mL/kg) 0 (0)    Other 0    Total Intake(mL/kg) 0 (0)    Urine (mL/kg/hr) 0    Blood 50    Total Output 50    Net -50         Urine Occurrence 1 x      Physical Exam: Alert and oriented X3 Lungs: Clear and unlabored Heart: regular rate and rhythm / no mumurs Abdomen: soft, non-tender, non-distended  Fundus: firm, non-tender, below umbilicus Perineum: Healing and well approximated Lochia: Appropriate Extremities: No edema, no calf pain or tenderness    A:  PPD #1 Doing well - stable status  P:  Routine post partum orders Breast-feeding support Anticipate D/C on 10/12/2018   Plan reviewed w/ Dr. Etheleen Nicks, MSN, CNM 10/11/2018, 10:05 AM

## 2018-10-12 MED ORDER — BENZOCAINE-MENTHOL 20-0.5 % EX AERO: 1.0000 "application " | INHALATION_SPRAY | CUTANEOUS | Status: AC | PRN

## 2018-10-12 MED ORDER — ACETAMINOPHEN 500 MG PO TABS: 1000.0000 mg | ORAL_TABLET | Freq: Four times a day (QID) | ORAL | 0 refills | Status: AC | PRN

## 2018-10-12 MED ORDER — COCONUT OIL OIL: 1.0000 "application " | TOPICAL_OIL | 0 refills | Status: AC | PRN

## 2018-10-12 MED ORDER — IBUPROFEN 600 MG PO TABS: 600.0000 mg | ORAL_TABLET | Freq: Four times a day (QID) | ORAL | 0 refills | Status: AC

## 2018-10-12 NOTE — Discharge Summary (Signed)
Obstetric Discharge Summary Reason for Admission: onset of labor and rupture of membranes Prenatal Procedures: ultrasound Intrapartum Procedures: spontaneous vaginal delivery and intravenous pain medication Postpartum Procedures: none Complications-Operative and Postpartum: labial obstetrical tear repaired Hemoglobin  Date Value Ref Range Status  10/11/2018 12.2 12.0 - 15.0 g/dL Final   HCT  Date Value Ref Range Status  10/11/2018 36.8 36.0 - 46.0 % Final   Hospital course:  Onset of Labor With Vaginal Delivery     23 y.o. yo G1P1001 at [redacted]w[redacted]d was admitted in Active Labor on 10/10/2018. Patient had an uncomplicated labor course as follows:  Membrane Rupture Time/Date: 4:00 PM ,10/10/2018  spontaneous Intrapartum Procedures: Episiotomy: None [1]                                         Lacerations:  Labial [10]  Patient had a delivery of a Viable infant. 10/10/2018  Information for the patient's newborn:  Cassidi, Modesitt Girl Akua [[id number]]  Delivery Method: Vag-Spont(filed from delivery)     Pateint had an uncomplicated postpartum course.  She is ambulating, tolerating a regular diet, passing flatus, and urinating well. Patient is discharged home in stable condition on 10/12/18.  Physical Exam:  General: alert, cooperative and no distress Lochia: appropriate Uterine Fundus: U-1, firm Perineum: tear repair well approximated, no edema DVT Evaluation: No cords or calf tenderness. No significant calf/ankle edema.  Discharge Diagnoses: Principal Problem:   Postpartum care following vaginal delivery 10/7 Active Problems:   Normal labor and delivery   SVD (10/7)   Obstetrical laceration - labial   Discharge Information: Date: 10/12/2018 Activity: pelvic rest Diet: routine Medications:  Allergies as of 10/12/2018   No Known Allergies     Medication List    STOP taking these medications   dicyclomine 20 MG tablet Commonly known as: BENTYL     TAKE these medications    acetaminophen 500 MG tablet Commonly known as: Tylenol Take 2 tablets (1,000 mg total) by mouth every 6 (six) hours as needed for moderate pain.   aluminum hydroxide-magnesium carbonate 95-358 MG/15ML Susp Commonly known as: GAVISCON Take 5 mLs by mouth daily as needed for indigestion or heartburn.   benzocaine-Menthol 20-0.5 % Aero Commonly known as: DERMOPLAST Apply 1 application topically as needed for irritation (perineal discomfort).   coconut oil Oil Apply 1 application topically as needed.   ibuprofen 600 MG tablet Commonly known as: ADVIL Take 1 tablet (600 mg total) by mouth every 6 (six) hours.   metroNIDAZOLE 500 MG tablet Commonly known as: FLAGYL Take 1 tablet (500 mg total) by mouth 2 (two) times daily.   Prenatal 1 30-0.975-200 MG Caps Take 2 capsules by mouth daily.   ranitidine 150 MG tablet Commonly known as: ZANTAC Take 1 tablet (150 mg total) by mouth 2 (two) times daily for 14 days.   sucralfate 1 g tablet Commonly known as: Carafate Take 1 tablet (1 g total) by mouth 4 (four) times daily as needed.            Discharge Care Instructions  (From admission, onward)         Start     Ordered   10/12/18 0000  Discharge wound care:    Comments: Sitz baths 2 times /day with warm water x 1 week. May add herbals: 1 ounce dried comfrey leaf* 1 ounce calendula flowers 1 ounce lavender flowers 1/2 ounce dried  uva ursi leaves 1/2 ounce witch hazel blossoms (if you can find them) 1/2 ounce dried sage leaf 1/2 cup sea salt Directions: Bring 2 quarts of water to a boil. Turn off heat, and place 1 ounce (approximately 1 large handful) of the above mixed herbs (not the salt) into the pot. Steep, covered, for 30 minutes.  Strain the liquid well with a fine mesh strainer, and discard the herb material. Add 2 quarts of liquid to the tub, along with the 1/2 cup of salt. This medicinal liquid can also be made into compresses and peri-rinses.   10/12/18 0643          Condition: stable Instructions: per hospital booklet Discharge to: home Follow-up Information    Ob/Gyn, [place]. Schedule an appointment as soon as possible for a visit in 6 week(s).   Specialty: Obstetrics and Gynecology Contact information: [address] [place] [address] [phone]           Newborn Data: Live born female  Birth Weight: 6 lb 7.4 oz (2931 g) APGAR: 8, 9  Newborn Delivery   Birth date/time: 10/10/2018 21:25:00 Delivery type: Vaginal, Spontaneous      Home with mother.  Neta Mends, CNM 10/12/2018, 6:44 AM

## 2018-10-12 NOTE — Lactation Note (Signed)
This note was copied from a baby's chart. Lactation Consultation Note  Patient Name: Megan Reyes IRCVE'L Date: 10/12/2018 Reason for consult: Follow-up assessment;1st time breastfeeding;Primapara;Early term 37-38.6wks  F/U visit with P1 mom who delivered @ 38.1wks, baby is now 37 hours old; anticipated discharge today.  LC entered room to find mom holding baby in cradle position, but seems awkward as mom leaning over and not supported. Mom states baby was frantic and she wanted to latch her quickly. Drop of colostrum noted on opposite breast.  Offered to assist mom to more comfortable position and mom agrees. Reviewed position options and mom states she would like to review football hold again. Infant positioned on right breast in football hold position. Mom with excellent technique. Reinforced to mom to get all the nipple plus as much areola as possible in baby's mouth during latch. Reviewed flanged lips and wide angle of baby's mouth. Left nipple slightly pink in the 5 o'clock position. Mom c/o initial pain/tenderness with latch on that side. Reinforced proper latch techniques and keeping baby close to breast with nose/chin touching breast during feeding. Also reviewed supporting mom's wrists with football hold position. Reviewed engorgement prevention and relief techniques with mom. Demonstrated reverse pressure softening if nipple too edematous to latch baby. Mom states she has a Medela pump at home and has not needed to pump in hospital. Reviewed proper flange fit. Upon inspection, mom may fit 23mm flange initially but then may need to increase to 52mm as breastfeeding/pumping continues. Encouraged mom to review milk storage guidelines in Mother Baby handbook.  Reviewed expected infant feeding behaviors and output until milk comes to volume. Reviewed appearance and size of stools with breastfeeding.  Reviewed OP lactation services and lactation brochure with phone number.  Maternal  Data Has patient been taught Hand Expression?: Yes Does the patient have breastfeeding experience prior to this delivery?: No  Feeding    LATCH Score Latch: Grasps breast easily, tongue down, lips flanged, rhythmical sucking.  Audible Swallowing: Spontaneous and intermittent  Type of Nipple: Everted at rest and after stimulation  Comfort (Breast/Nipple): Filling, red/small blisters or bruises, mild/mod discomfort  Hold (Positioning): Assistance needed to correctly position infant at breast and maintain latch.  LATCH Score: 8  Interventions Interventions: Breast feeding basics reviewed;Assisted with latch;Skin to skin;Breast massage;Hand express;Pre-pump if needed;Reverse pressure;Support pillows;Position options;Expressed milk;DEBP;Ice  Consult Status Consult Status: Complete    Cranston Neighbor 10/12/2018, 11:22 AM

## 2019-03-05 IMAGING — US US ABDOMEN LIMITED
1 series · 14 of 25 positions shown · non-contrast
Comparison: February 07, 2018

CLINICAL DATA: Epigastric region pain

EXAM:
ULTRASOUND ABDOMEN LIMITED RIGHT UPPER QUADRANT

[Series 1: us abdomen limited · 14 of 43 slices shown]
[im 1/43]
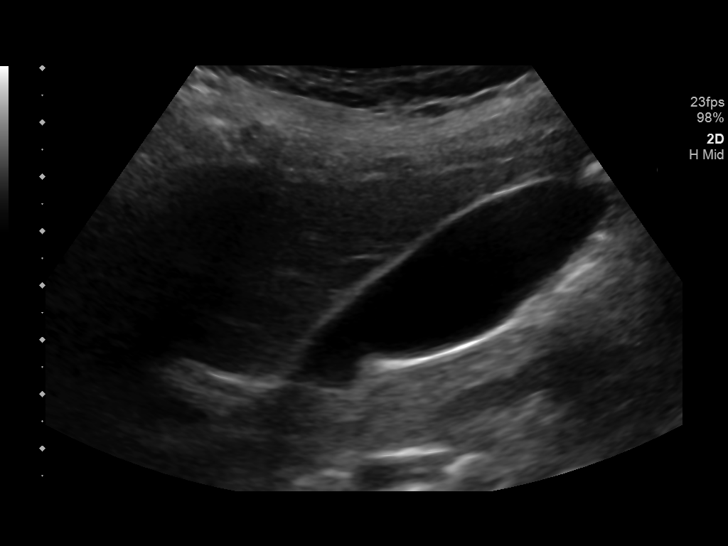
[im 4/43]
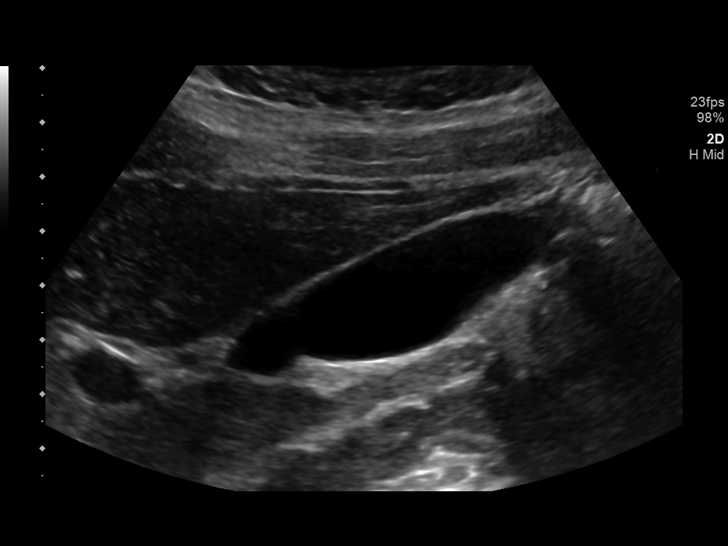
[im 8/43]
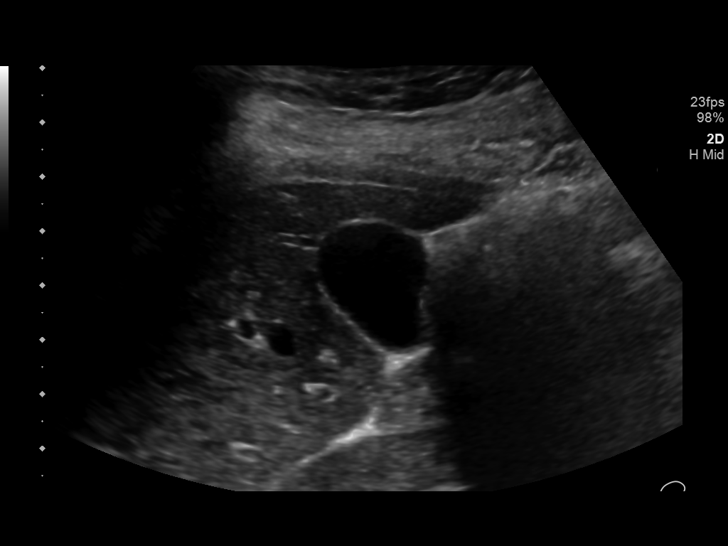
[im 11/43]
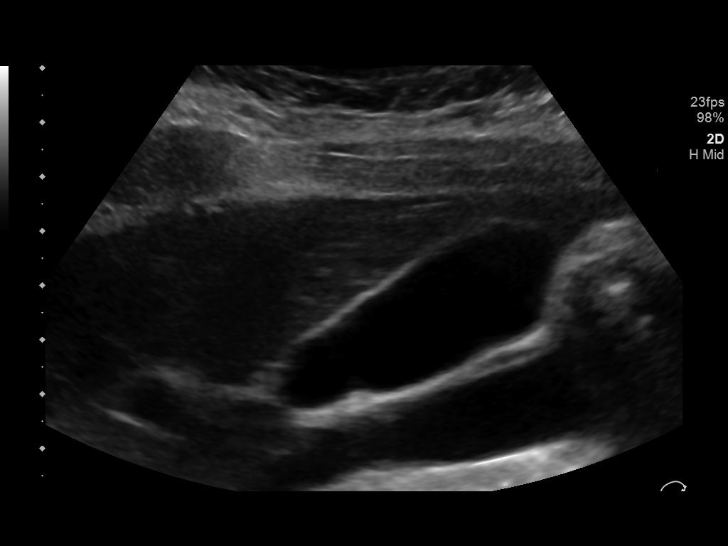
[im 15/43]
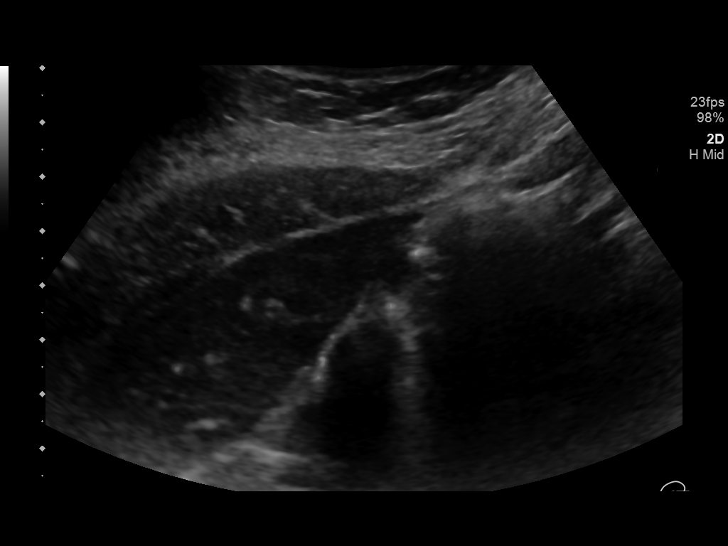
[im 16/43]
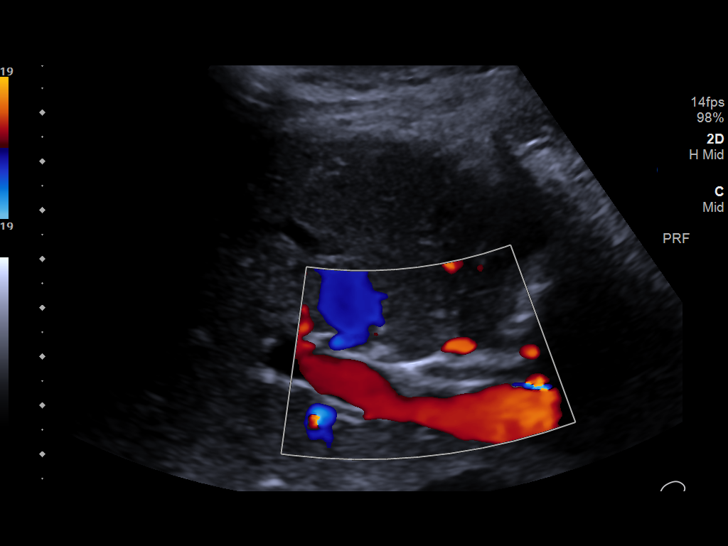
[im 20/43]
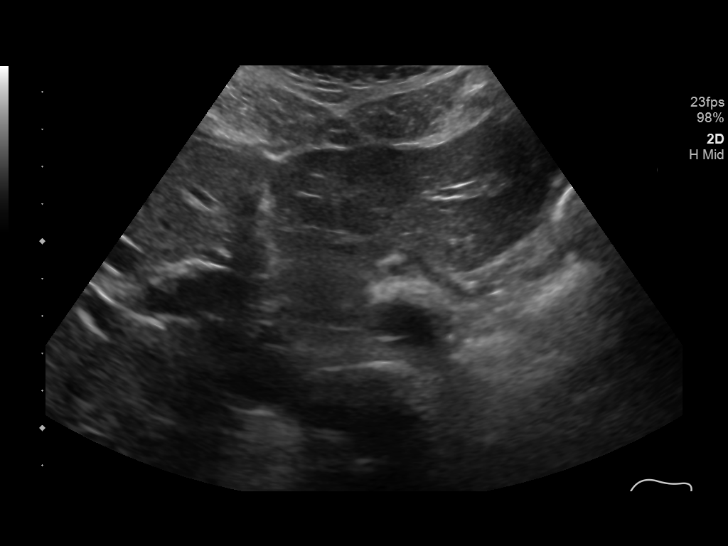
[im 23/43]
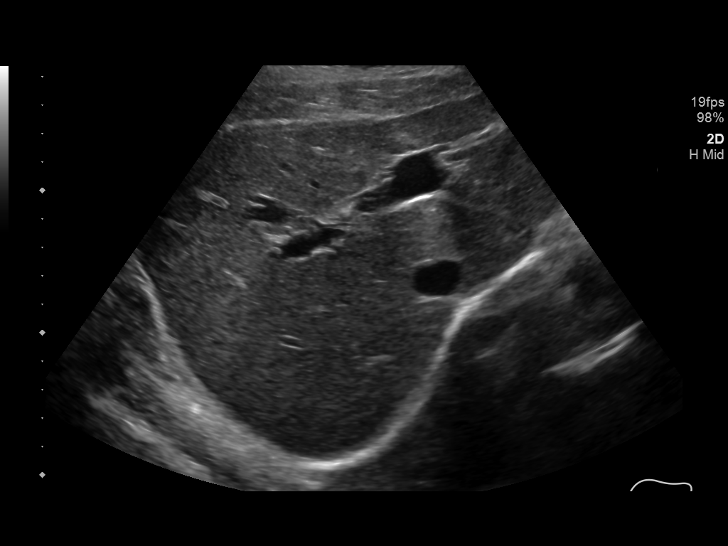
[im 27/43]
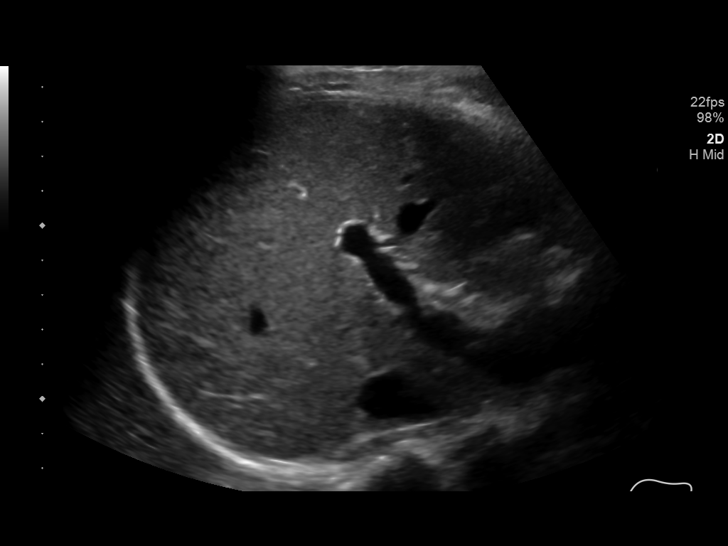
[im 29/43]
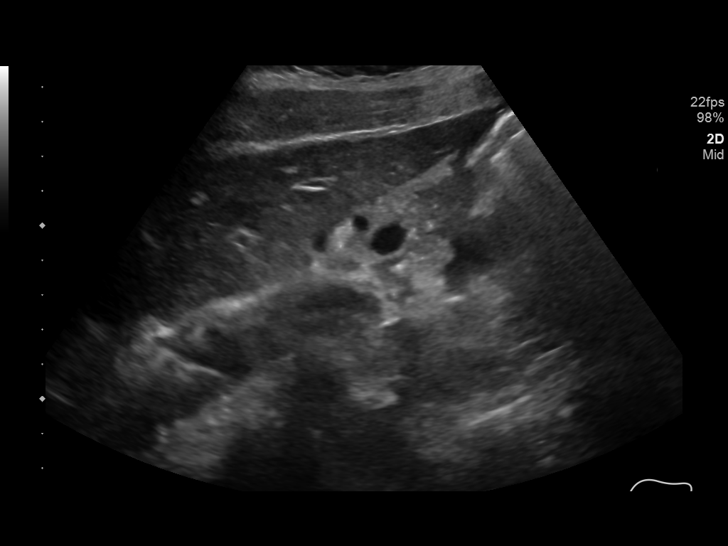
[im 32/43]
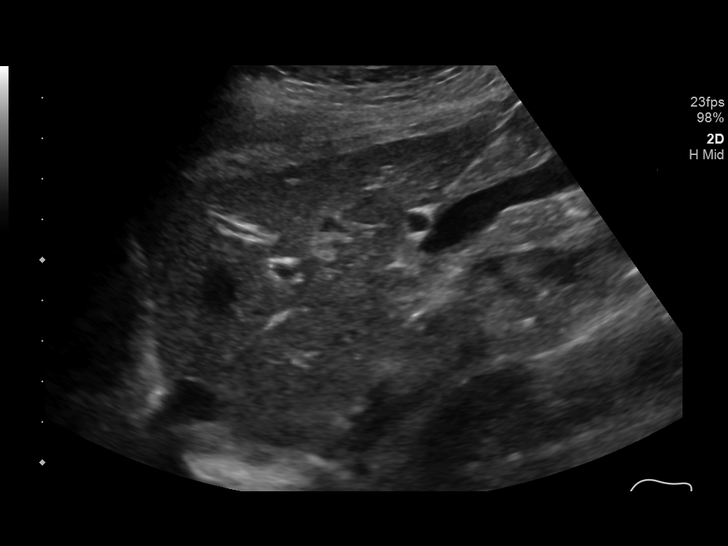
[im 36/43]
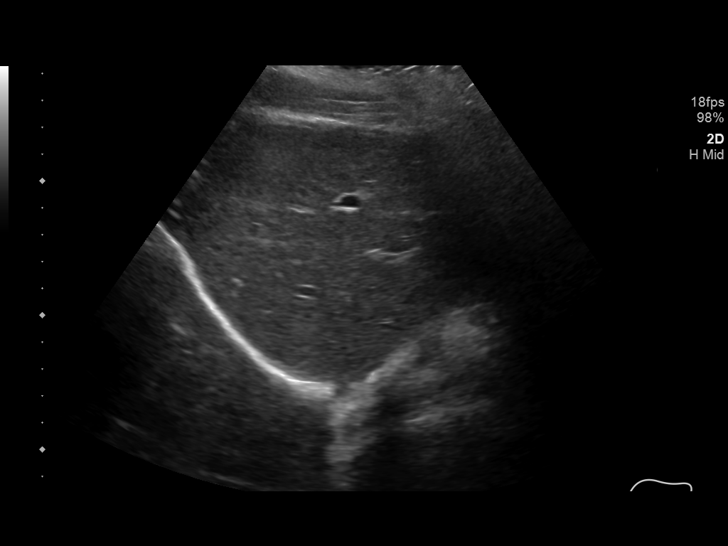
[im 39/43]
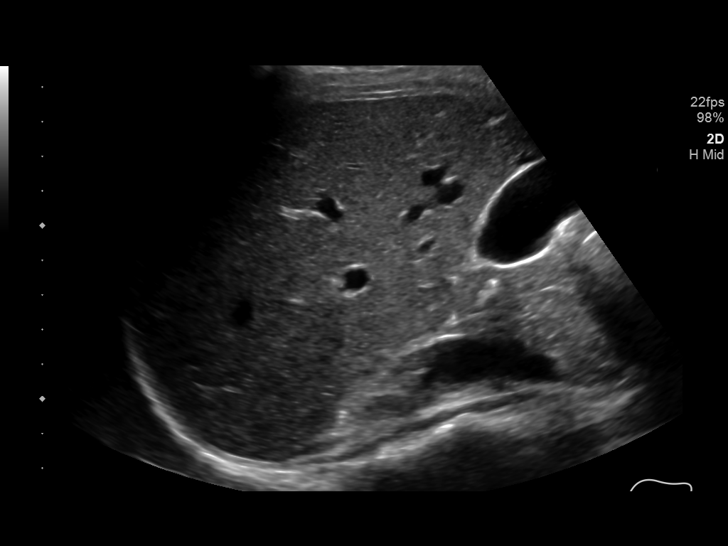
[im 43/43]
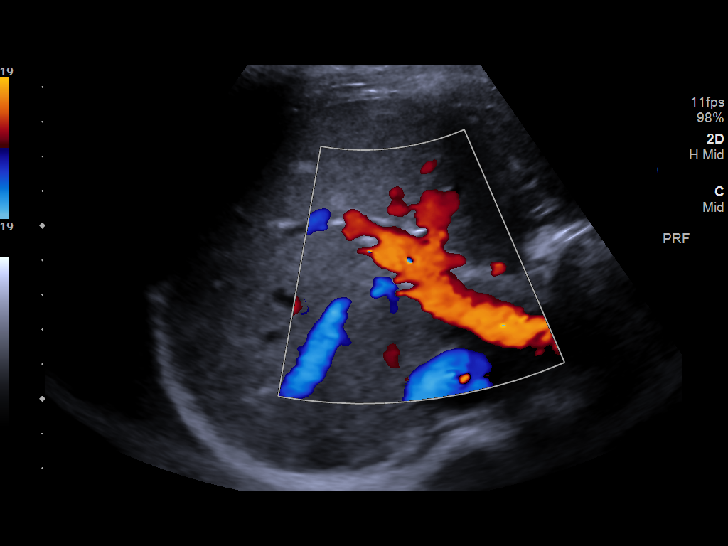

[14 of 25 positions shown; findings below may reference images not displayed]

FINDINGS: Gallbladder:

No gallstones or wall thickening visualized. There is no
pericholecystic fluid. No sonographic Murphy sign noted by
sonographer.

Common bile duct:

Diameter: 3 mm. No intrahepatic or extrahepatic biliary duct
dilatation.

Liver:

No focal lesion identified. Within normal limits in parenchymal
echogenicity. Portal vein is patent on color Doppler imaging with
normal direction of blood flow towards the liver.
IMPRESSION: Study within normal limits.

## 2020-03-22 IMAGING — US US ABDOMEN LIMITED
1 series · 14 of 25 positions shown · non-contrast
Comparison: None.

CLINICAL DATA: Right upper quadrant pain

EXAM:
ULTRASOUND ABDOMEN LIMITED RIGHT UPPER QUADRANT

[Series 1: us abdomen limited · 0.17mm/px · 14 of 44 slices shown]
[im 1/44]
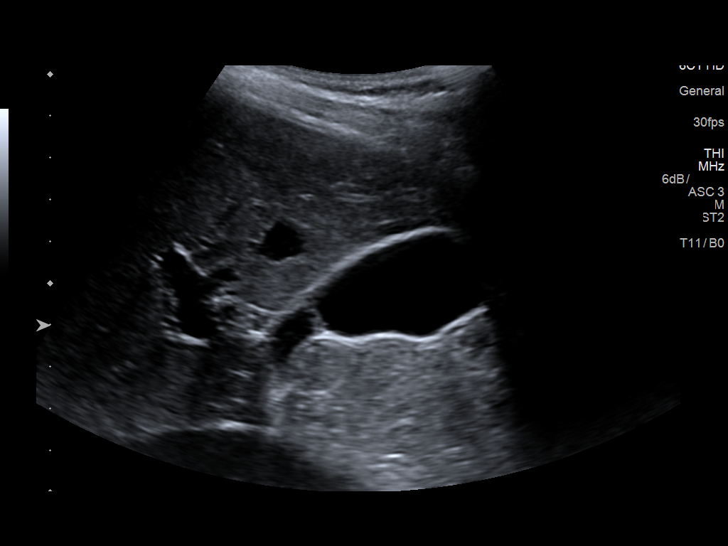
[im 4/44]
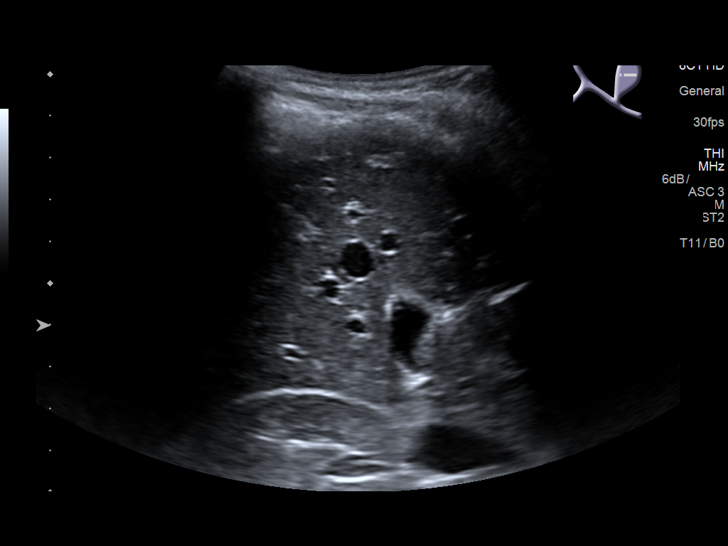
[im 8/44]
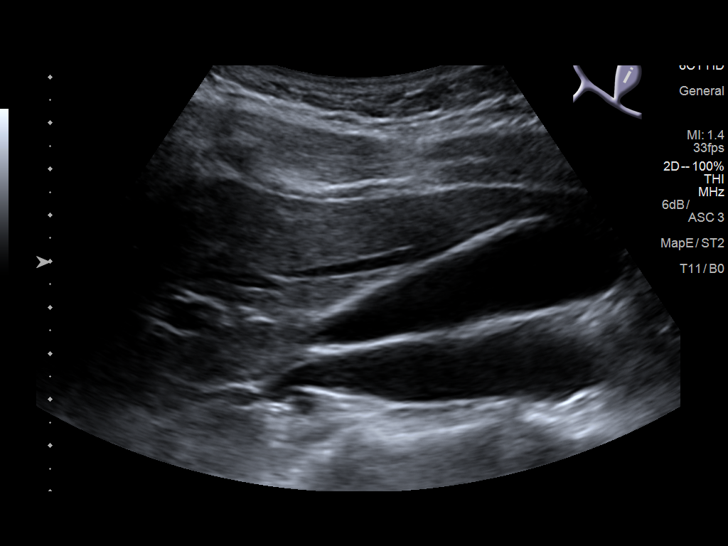
[im 11/44]
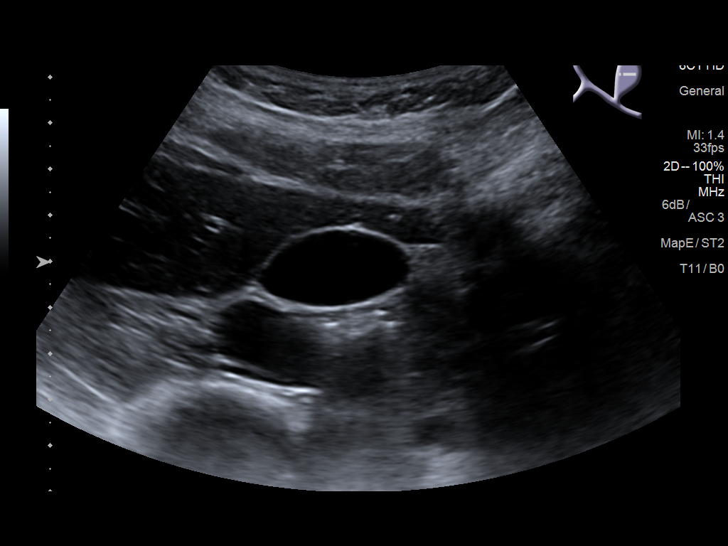
[im 15/44]
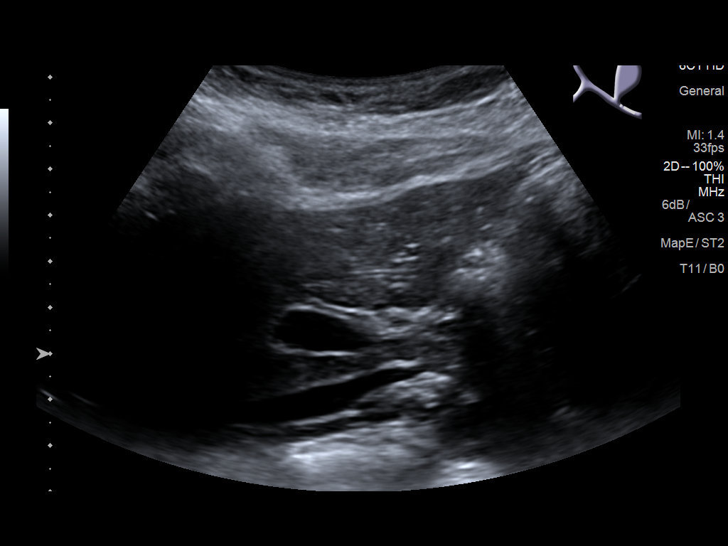
[im 17/44]
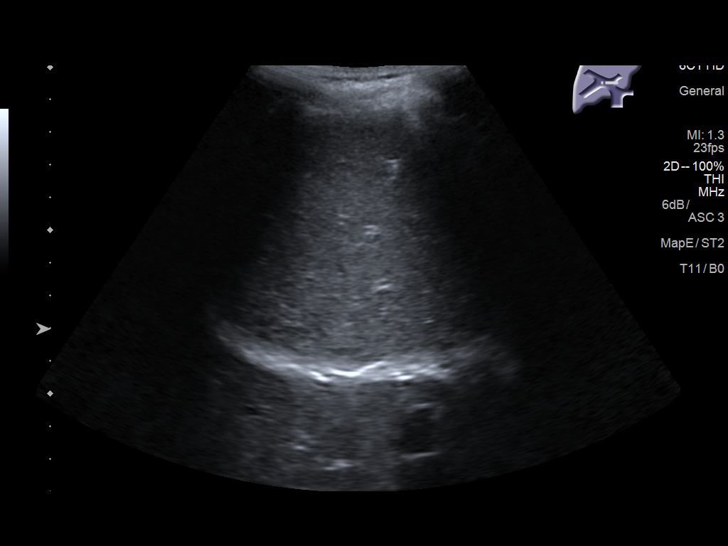
[im 20/44]
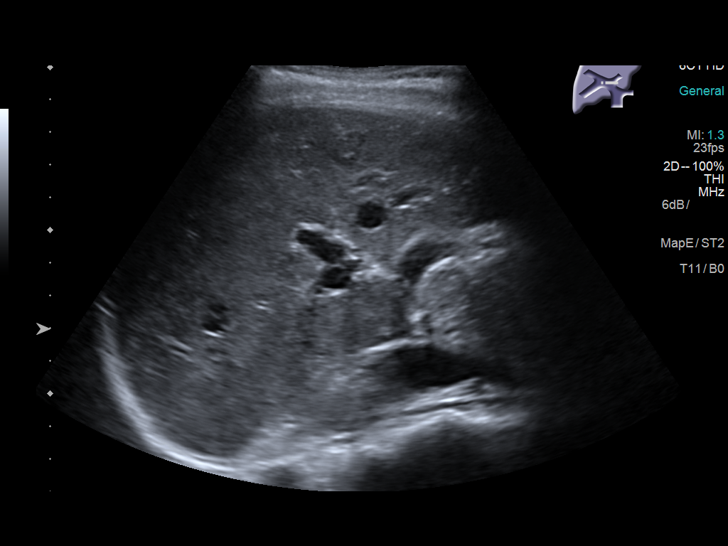
[im 24/44]
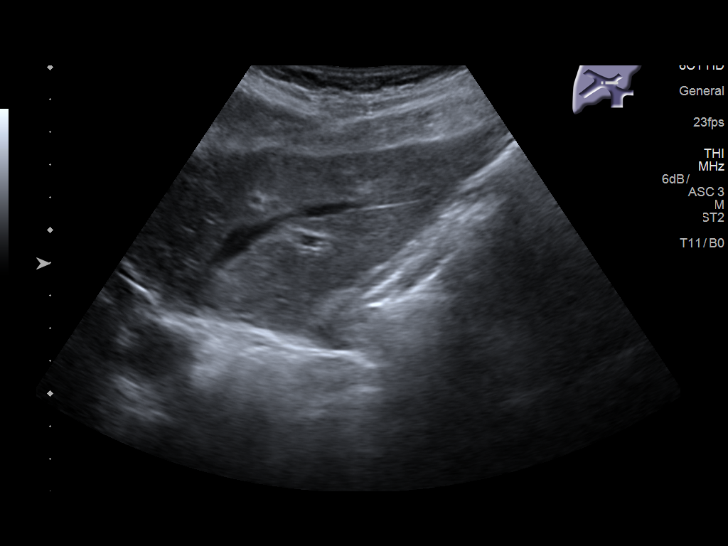
[im 27/44]
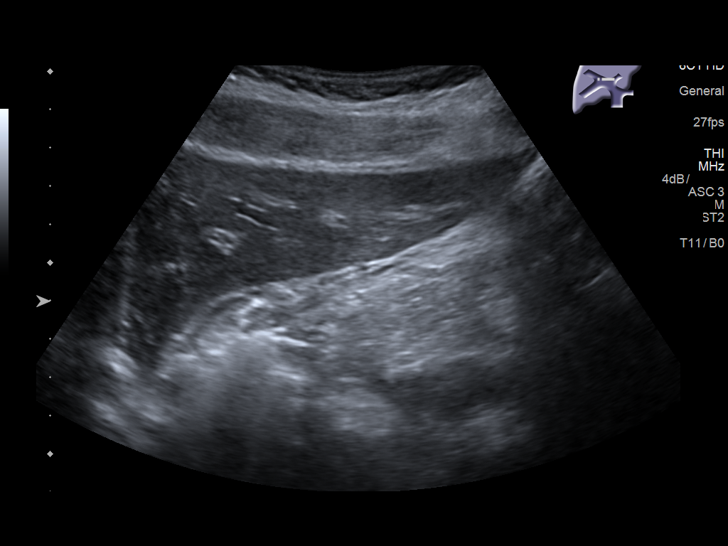
[im 29/44]
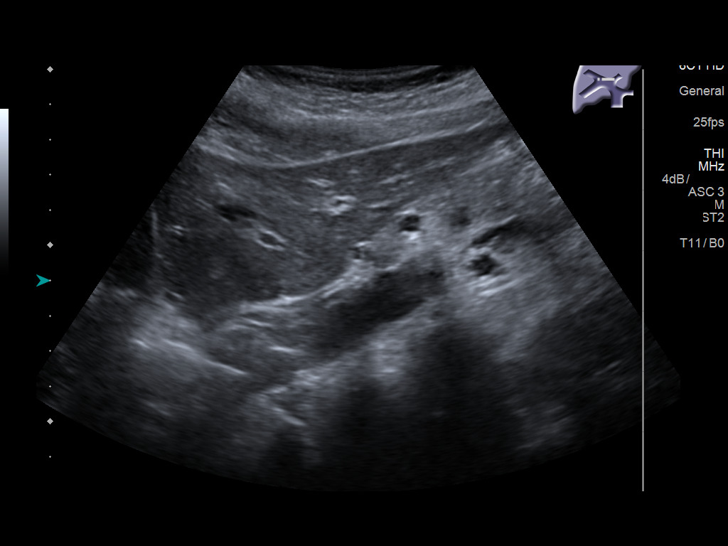
[im 33/44]
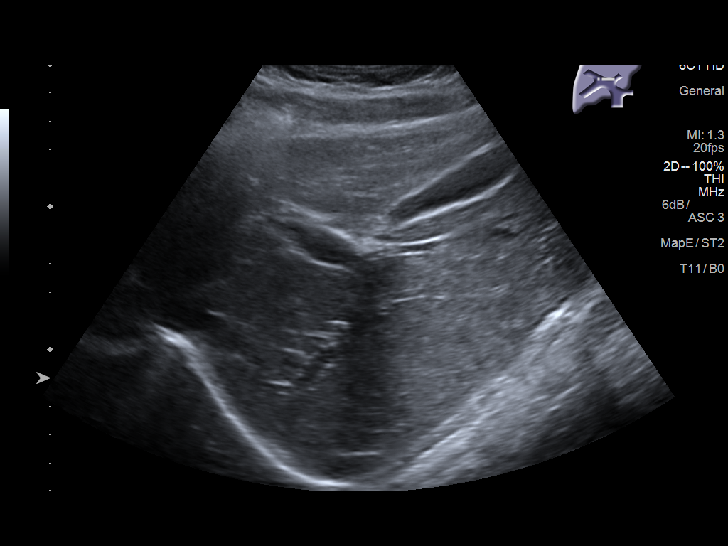
[im 36/44]
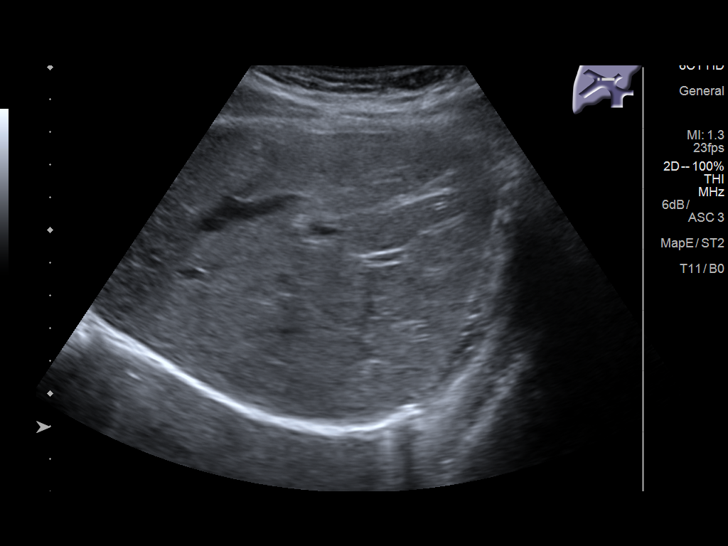
[im 40/44]
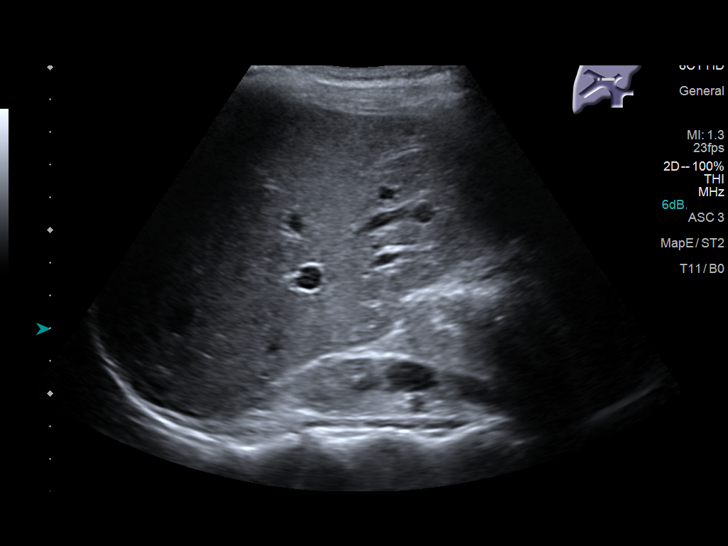
[im 44/44]
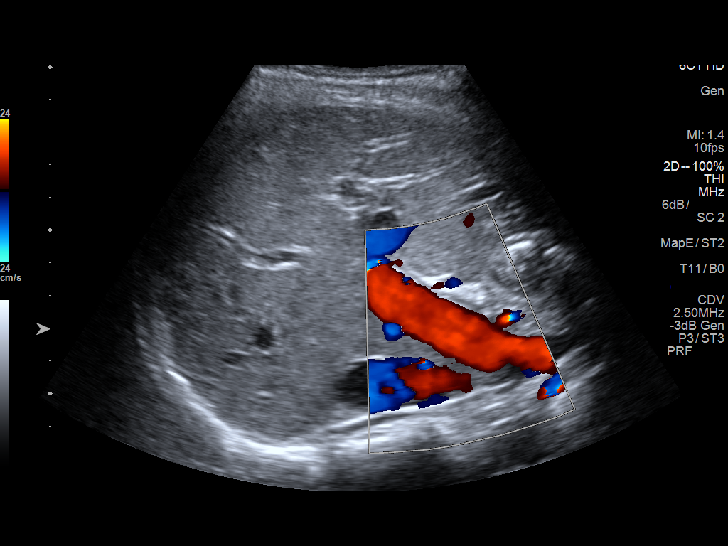

[14 of 25 positions shown; findings below may reference images not displayed]

FINDINGS: Gallbladder:

No gallstones or wall thickening visualized. There is no
pericholecystic fluid. No sonographic Murphy sign noted by
sonographer.

Common bile duct:

Diameter: 3 mm. No intrahepatic or extrahepatic biliary duct
dilatation.

Liver:

No focal lesion identified. Within normal limits in parenchymal
echogenicity. Portal vein is patent on color Doppler imaging with
normal direction of blood flow towards the liver.
IMPRESSION: Study within normal limits.
